# Patient Record
Sex: Female | Born: 1975
Health system: Southern US, Community
[De-identification: ages and names within clinical notes are randomized; demographics above are authoritative.]

## PROBLEM LIST (undated history)

## (undated) DIAGNOSIS — R8789 Other abnormal findings in specimens from female genital organs: Principal | ICD-10-CM

## (undated) DIAGNOSIS — F419 Anxiety disorder, unspecified: Secondary | ICD-10-CM

## (undated) DIAGNOSIS — N390 Urinary tract infection, site not specified: Secondary | ICD-10-CM

## (undated) DIAGNOSIS — N83209 Unspecified ovarian cyst, unspecified side: Secondary | ICD-10-CM

## (undated) DIAGNOSIS — Z7689 Persons encountering health services in other specified circumstances: Secondary | ICD-10-CM

## (undated) DIAGNOSIS — IMO0002 Reserved for concepts with insufficient information to code with codable children: Secondary | ICD-10-CM

## (undated) DIAGNOSIS — K219 Gastro-esophageal reflux disease without esophagitis: Secondary | ICD-10-CM

## (undated) DIAGNOSIS — G43909 Migraine, unspecified, not intractable, without status migrainosus: Secondary | ICD-10-CM

## (undated) DIAGNOSIS — N949 Unspecified condition associated with female genital organs and menstrual cycle: Secondary | ICD-10-CM

## (undated) HISTORY — DX: Urinary tract infection, site not specified: N39.0

## (undated) HISTORY — DX: Gastro-esophageal reflux disease without esophagitis: K21.9

## (undated) HISTORY — DX: Unspecified ovarian cyst, unspecified side: N83.209

## (undated) HISTORY — DX: Other abnormal findings in specimens from female genital organs: R87.89

## (undated) HISTORY — PX: WISDOM TOOTH EXTRACTION: SHX21

## (undated) HISTORY — DX: Unspecified condition associated with female genital organs and menstrual cycle: N94.9

## (undated) HISTORY — DX: Anxiety disorder, unspecified: F41.9

## (undated) HISTORY — DX: Reserved for concepts with insufficient information to code with codable children: IMO0002

## (undated) HISTORY — DX: Migraine, unspecified, not intractable, without status migrainosus: G43.909

## (undated) HISTORY — DX: Persons encountering health services in other specified circumstances: Z76.89

## (undated) HISTORY — PX: LAPAROSCOPY: SHX197

---

## 2001-03-05 ENCOUNTER — Ambulatory Visit (HOSPITAL_COMMUNITY): Admission: RE | Admit: 2001-03-05 | Discharge: 2001-03-05 | Payer: Self-pay | Admitting: Obstetrics and Gynecology

## 2008-10-27 ENCOUNTER — Other Ambulatory Visit: Admission: RE | Admit: 2008-10-27 | Discharge: 2008-10-27 | Payer: Self-pay | Admitting: Obstetrics and Gynecology

## 2010-01-25 ENCOUNTER — Other Ambulatory Visit: Admission: RE | Admit: 2010-01-25 | Discharge: 2010-01-25 | Payer: Self-pay | Admitting: Obstetrics and Gynecology

## 2010-08-31 NOTE — Op Note (Signed)
[facility]  Patient:    Michelle Gonzalez, Michelle Gonzalez Visit Number: [id number] MRN: [id number]          Service Type: DSU Location: DAY Attending Physician:  Tilda Burrow Dictated by:   Christin Bach, M.D. Proc. Date: 03/05/01 Admit Date:  03/05/2001 Discharge Date: 03/05/2001                             Operative Report  PREOPERATIVE DIAGNOSIS:  Right upper quadrant pain, suspect right ovarian cyst.  POSTOPERATIVE DIAGNOSIS:  Right upper quadrant pain, suspect right ovarian cyst.  PROCEDURE:  Diagnostic laparoscopy, tubal dye studies.  SURGEON:  Christin Bach, M.D.  ANESTHESIA:  General.  COMPLICATIONS:  None.  ESTIMATED BLOOD LOSS:  None.  FINDINGS:  Tiny amount of adhesions from the small bowel and terminal ileum to the pelvic brim on the right. Evidence of patency of fallopian tubes bilaterally. Relaxed round ligament structures bilaterally.  No evidence of endometriosis.  DESCRIPTION OF PROCEDURE:  The patient was taken to the operating room, prepped and draped in the usual fashion for abdominal and vaginal procedure. The infraumbilical, vertical 1 cm skin incision was used to achieve pneumoperitoneum with Veress needle used to insufflation.  Laparoscopic trocar was introduced through the umbilicus and the suprapubic 5 mm port placed under direct visualization.  There was no evidence of any trauma associated with insertion.  Attention was directed to the pelvis which could be visualized.  A Cohen cannula had already been attached to the cervix for tubal dye study. The patient then had visualization of the pelvic structures photographed, with with some filmy adhesions in the right lower quadrant at the level of the ileum. Tubal dye studies showed excellent patency of each fallopian tube.  The ovaries appeared grossly normal and there was no evidence of endometriosis.  There was extensive vascular network inside the pelvis.  The right  adnexa also had a simple cyst and the right ovary measuring approximately 3 cm.  This was considered to be an unruptured corpus luteum cyst or follicular cyst that was left alone.  The patient was then allowed to awaken after deflation of the abdomen was made by the laparoscopic deployment, and subcuticular 4-0 Dexon closure of the laparoscopic port.  The patient tolerated the procedure well and went to the recovery room in good condition.  ADDENDUM:  It should be noted that the right lower quadrant adhesions from the terminal ileum were cut free to improve pelvic mobility. Dictated by:   Christin Bach, M.D. Attending Physician:  Tilda Burrow DD:  03/29/01 TD:  03/29/01 Job: [id number]

## 2013-03-25 ENCOUNTER — Encounter: Payer: Self-pay | Admitting: Adult Health

## 2013-03-25 ENCOUNTER — Ambulatory Visit (INDEPENDENT_AMBULATORY_CARE_PROVIDER_SITE_OTHER): Payer: BC Managed Care – PPO | Admitting: Adult Health

## 2013-03-25 ENCOUNTER — Encounter (INDEPENDENT_AMBULATORY_CARE_PROVIDER_SITE_OTHER): Payer: Self-pay

## 2013-03-25 VITALS — BP 120/80 | Ht 66.0 in | Wt 166.0 lb

## 2013-03-25 DIAGNOSIS — N949 Unspecified condition associated with female genital organs and menstrual cycle: Secondary | ICD-10-CM

## 2013-03-25 HISTORY — DX: Unspecified condition associated with female genital organs and menstrual cycle: N94.9

## 2013-03-25 NOTE — Patient Instructions (Signed)
Pelvic Pain, Female Female pelvic pain can be caused by many different things and start from a variety of places. Pelvic pain refers to pain that is located in the lower half of the abdomen and between your hips. The pain may occur over a short period of time (acute) or may be reoccurring (chronic). The cause of pelvic pain may be related to disorders affecting the female reproductive organs (gynecologic), but it may also be related to the bladder, kidney stones, an intestinal complication, or muscle or skeletal problems. Getting help right away for pelvic pain is important, especially if there has been severe, sharp, or a sudden onset of unusual pain. It is also important to get help right away because some types of pelvic pain can be life threatening.  CAUSES  Below are only some of the causes of pelvic pain. The causes of pelvic pain can be in one of several categories.   Gynecologic.  Pelvic inflammatory disease.  Sexually transmitted infection.  Ovarian cyst or a twisted ovarian ligament (ovarian torsion).  Uterine lining that grows outside the uterus (endometriosis).  Fibroids, cysts, or tumors.  Ovulation.  Pregnancy.  Pregnancy that occurs outside the uterus (ectopic pregnancy).  Miscarriage.  Labor.  Abruption of the placenta or ruptured uterus.  Infection.  Uterine infection (endometritis).  Bladder infection.  Diverticulitis.  Miscarriage related to a uterine infection (septic abortion).  Bladder.  Inflammation of the bladder (cystitis).  Kidney stone(s).  Gastrointenstinal.  Constipation.  Diverticulitis.  Neurologic.  Trauma.  Feeling pelvic pain because of mental or emotional causes (psychosomatic).  Cancers of the bowel or pelvis. EVALUATION  Your caregiver will want to take a careful history of your concerns. This includes recent changes in your health, a careful gynecologic history of your periods (menses), and a sexual history. Obtaining  your family history and medical history is also important. Your caregiver may suggest a pelvic exam. A pelvic exam will help identify the location and severity of the pain. It also helps in the evaluation of which organ system may be involved. In order to identify the cause of the pelvic pain and be properly treated, your caregiver may order tests. These tests may include:   A pregnancy test.  Pelvic ultrasonography.  An X-ray exam of the abdomen.  A urinalysis or evaluation of vaginal discharge.  Blood tests. HOME CARE INSTRUCTIONS   Only take over-the-counter or prescription medicines for pain, discomfort, or fever as directed by your caregiver.   Rest as directed by your caregiver.   Eat a balanced diet.   Drink enough fluids to make your urine clear or pale yellow, or as directed.   Avoid sexual intercourse if it causes pain.   Apply warm or cold compresses to the lower abdomen depending on which one helps the pain.   Avoid stressful situations.   Keep a journal of your pelvic pain. Write down when it started, where the pain is located, and if there are things that seem to be associated with the pain, such as food or your menstrual cycle.  Follow up with your caregiver as directed.  SEEK MEDICAL CARE IF:  Your medicine does not help your pain.  You have abnormal vaginal discharge. SEEK IMMEDIATE MEDICAL CARE IF:   You have heavy bleeding from the vagina.   Your pelvic pain increases.   You feel lightheaded or faint.   You have chills.   You have pain with urination or blood in your urine.   You have uncontrolled  diarrhea or vomiting.   You have a fever or persistent symptoms for more than 3 days.  You have a fever and your symptoms suddenly get worse.   You are being physically or sexually abused.  MAKE SURE YOU:  Understand these instructions.  Will watch your condition.  Will get help if you are not doing well or get worse. Document  Released: 02/27/2004 Document Revised: 10/01/2011 Document Reviewed: 07/22/2011 [facility] Patient Information 2014 [place], [place]. Follow up in 5 days for [place]

## 2013-03-25 NOTE — Progress Notes (Signed)
Subjective:     Patient ID: Michelle Gonzalez, female   DOB: 01/02/1976, 37 y.o.   MRN: [id number]  HPI Michelle Gonzalez is a 37 year old white female, married in complaining of pelvic pain, nausea at times, and recurrent UTIs, occasional discharge with odor.Sex hurts at times.Has ruptured disc at L5 and is on meds, also takes macrobid with sex.Has noted periods are heavier.  Review of Systems See HPI Reviewed past medical,surgical, social and family history. Reviewed medications and allergies.     Objective:   Physical Exam BP 120/80  Ht 5\' 6"  (1.676 m)  Wt 166 lb (75.297 kg)  BMI 26.81 kg/m2  LMP 03/17/2013   urine negative, Skin warm and dry.Pelvic: external genitalia is normal in appearance, vagina: white discharge without odor, cervix:smooth, uterus: normal size, shape and contour, non tender, no masses felt, adnexa: no masses, has tenderness noted both ovary area.  Assessment:    Pelvic pain    Plan:     Follow up in 5 days for [place] and see me Review handout on pelvic pain

## 2013-03-30 ENCOUNTER — Other Ambulatory Visit: Payer: Self-pay | Admitting: Adult Health

## 2013-03-30 ENCOUNTER — Encounter: Payer: Self-pay | Admitting: Adult Health

## 2013-03-30 ENCOUNTER — Ambulatory Visit (INDEPENDENT_AMBULATORY_CARE_PROVIDER_SITE_OTHER): Payer: BC Managed Care – PPO | Admitting: Adult Health

## 2013-03-30 ENCOUNTER — Ambulatory Visit (INDEPENDENT_AMBULATORY_CARE_PROVIDER_SITE_OTHER): Payer: BC Managed Care – PPO

## 2013-03-30 VITALS — BP 124/80 | Ht 67.0 in | Wt 166.0 lb

## 2013-03-30 DIAGNOSIS — N920 Excessive and frequent menstruation with regular cycle: Secondary | ICD-10-CM

## 2013-03-30 DIAGNOSIS — N83209 Unspecified ovarian cyst, unspecified side: Secondary | ICD-10-CM | POA: Insufficient documentation

## 2013-03-30 DIAGNOSIS — IMO0002 Reserved for concepts with insufficient information to code with codable children: Secondary | ICD-10-CM

## 2013-03-30 DIAGNOSIS — N949 Unspecified condition associated with female genital organs and menstrual cycle: Secondary | ICD-10-CM

## 2013-03-30 HISTORY — DX: Unspecified ovarian cyst, unspecified side: N83.209

## 2013-03-30 MED ORDER — NORETHIN ACE-ETH ESTRAD-FE 1-20 MG-MCG(24) PO CHEW: 1.0000 | CHEWABLE_TABLET | Freq: Every day | ORAL | Status: DC

## 2013-03-30 NOTE — Patient Instructions (Signed)
Menorrhagia Dysfunctional uterine bleeding is different from a normal menstrual period. When periods are heavy or there is more bleeding than is usual for you, it is called menorrhagia. It may be caused by hormonal imbalance, or physical, metabolic, or other problems. Examination is necessary in order that your caregiver may treat treatable causes. If this is a continuing problem, a D&C may be needed. That means that the cervix (the opening of the uterus or womb) is dilated (stretched larger) and the lining of the uterus is scraped out. The tissue scraped out is then examined under a microscope by a specialist (pathologist) to make sure there is nothing of concern that needs further or more extensive treatment. HOME CARE INSTRUCTIONS   If medications were prescribed, take exactly as directed. Do not change or switch medications without consulting your caregiver.  Long term heavy bleeding may result in iron deficiency. Your caregiver may have prescribed iron pills. They help replace the iron your body lost from heavy bleeding. Take exactly as directed. Iron may cause constipation. If this becomes a problem, increase the bran, fruits, and roughage in your diet.  Do not take aspirin or medicines that contain aspirin one week before or during your menstrual period. Aspirin may make the bleeding worse.  If you need to change your sanitary pad or tampon more than once every 2 hours, stay in bed and rest as much as possible until the bleeding stops.  Eat well-balanced meals. Eat foods high in iron. Examples are leafy green vegetables, meat, liver, eggs, and whole grain breads and cereals. Do not try to lose weight until the abnormal bleeding has stopped and your blood iron level is back to normal. SEEK MEDICAL CARE IF:   You need to change your sanitary pad or tampon more than once an hour.  You develop nausea (feeling sick to your stomach) and vomiting, dizziness, or diarrhea while you are taking your  medicine.  You have any problems that may be related to the medicine you are taking. SEEK IMMEDIATE MEDICAL CARE IF:   You have a fever.  You develop chills.  You develop severe bleeding or start to pass blood clots.  You feel dizzy or faint. MAKE SURE YOU:   Understand these instructions.  Will watch your condition.  Will get help right away if you are not doing well or get worse. Document Released: 04/01/2005 Document Revised: 06/24/2011 Document Reviewed: 09/20/2012 [facility] Patient Information 2014 [place], [place]. Ovarian Cyst The ovaries are small organs that are on each side of the uterus. The ovaries are the organs that produce the female hormones, estrogen and progesterone. An ovarian cyst is a sac filled with fluid that can vary in its size. It is normal for a small cyst to form in women who are in the childbearing age and who have menstrual periods. This type of cyst is called a follicle cyst that becomes an ovulation cyst (corpus luteum cyst) after it produces the women's egg. It later goes away on its own if the woman does not become pregnant. There are other kinds of ovarian cysts that may cause problems and may need to be treated. The most serious problem is a cyst with cancer. It should be noted that menopausal women who have an ovarian cyst are at a higher risk of it being a cancer cyst. They should be evaluated very quickly, thoroughly and followed closely. This is especially true in menopausal women because of the high rate of ovarian cancer in women in  menopause. CAUSES AND TYPES OF OVARIAN CYSTS:  FUNCTIONAL CYST: The follicle/corpus luteum cyst is a functional cyst that occurs every month during ovulation with the menstrual cycle. They go away with the next menstrual cycle if the woman does not get pregnant. Usually, there are no symptoms with a functional cyst.  ENDOMETRIOMA CYST: This cyst develops from the lining of the uterus tissue. This cyst gets in or on the  ovary. It grows every month from the bleeding during the menstrual period. It is also called a "chocolate cyst" because it becomes filled with blood that turns brown. This cyst can cause pain in the lower abdomen during intercourse and with your menstrual period.  CYSTADENOMA CYST: This cyst develops from the cells on the outside of the ovary. They usually are not cancerous. They can get very big and cause lower abdomen pain and pain with intercourse. This type of cyst can twist on itself, cut off its blood supply and cause severe pain. It also can easily rupture and cause a lot of pain.  DERMOID CYST: This type of cyst is sometimes found in both ovaries. They are found to have different kinds of body tissue in the cyst. The tissue includes skin, teeth, hair, and/or cartilage. They usually do not have symptoms unless they get very big. Dermoid cysts are rarely cancerous.  POLYCYSTIC OVARY: This is a rare condition with hormone problems that produces many small cysts on both ovaries. The cysts are follicle-like cysts that never produce an egg and become a corpus luteum. It can cause an increase in body weight, infertility, acne, increase in body and facial hair and lack of menstrual periods or rare menstrual periods. Many women with this problem develop type 2 diabetes. The exact cause of this problem is unknown. A polycystic ovary is rarely cancerous.  THECA LUTEIN CYST: Occurs when too much hormone (human chorionic gonadotropin) is produced and over-stimulates the ovaries to produce an egg. They are frequently seen when doctors stimulate the ovaries for invitro-fertilization (test tube babies).  LUTEOMA CYST: This cyst is seen during pregnancy. Rarely it can cause an obstruction to the birth canal during labor and delivery. They usually go away after delivery. SYMPTOMS   Pelvic pain or pressure.  Pain during sexual intercourse.  Increasing girth (swelling) of the abdomen.  Abnormal menstrual  periods.  Increasing pain with menstrual periods.  You stop having menstrual periods and you are not pregnant. DIAGNOSIS  The diagnosis can be made during:  Routine or annual pelvic examination (common).  Ultrasound.  X-ray of the pelvis.  CT Scan.  MRI.  Blood tests. TREATMENT   Treatment may only be to follow the cyst monthly for 2 to 3 months with your caregiver. Many go away on their own, especially functional cysts.  May be aspirated (drained) with a long needle with ultrasound, or by laparoscopy (inserting a tube into the pelvis through a small incision).  The whole cyst can be removed by laparoscopy.  Sometimes the cyst may need to be removed through an incision in the lower abdomen.  Hormone treatment is sometimes used to help dissolve certain cysts.  Birth control pills are sometimes used to help dissolve certain cysts. HOME CARE INSTRUCTIONS  Follow your caregiver's advice regarding:  Medicine.  Follow up visits to evaluate and treat the cyst.  You may need to come back or make an appointment with another caregiver, to find the exact cause of your cyst, if your caregiver is not a gynecologist.  Get  your yearly and recommended pelvic examinations and Pap tests.  Let your caregiver know if you have had an ovarian cyst in the past. SEEK MEDICAL CARE IF:   Your periods are late, irregular, they stop, or are painful.  Your stomach (abdomen) or pelvic pain does not go away.  Your stomach becomes larger or swollen.  You have pressure on your bladder or trouble emptying your bladder completely.  You have painful sexual intercourse.  You have feelings of fullness, pressure, or discomfort in your stomach.  You lose weight for no apparent reason.  You feel generally ill.  You become constipated.  You lose your appetite.  You develop acne.  You have an increase in body and facial hair.  You are gaining weight, without changing your exercise and  eating habits.  You think you are pregnant. SEEK IMMEDIATE MEDICAL CARE IF:   You have increasing abdominal pain.  You feel sick to your stomach (nausea) and/or vomit.  You develop a fever that comes on suddenly.  You develop abdominal pain during a bowel movement.  Your menstrual periods become heavier than usual. Document Released: 04/01/2005 Document Revised: 06/24/2011 Document Reviewed: 02/02/2009 [facility] Patient Information 2014 [place], [place]. Start pills with period Follow up in 3 months for [place]

## 2013-03-30 NOTE — Progress Notes (Signed)
Subjective:     Patient ID: Michelle Gonzalez, female   DOB: 16-Aug-1975, 37 y.o.   MRN: [id number]  HPI Michelle Gonzalez is back for [place] for pain with sex and bleeding heavy.Says she bleeds heavy for about 3 days, changes pad every 1-2 hours at times.  Review of Systems See HPI Reviewed past medical,surgical, social and family history. Reviewed medications and allergies.     Objective:   Physical Exam BP 124/80  Ht 5\' 7"  (1.702 m)  Wt 166 lb (75.297 kg)  BMI 25.99 kg/m2  LMP 03/17/2013   Reviewed [place] with pt and husband, US shows a 8.6 x 5.3 x 4.0 uterus without masses, an endometrium of 14.3 mm no masses, left ovary 2.4 x 1.9 x 1.3 cm and a right ovary 3.2 x 2.4 x 2.3 cm with a 2.4 x 1.6 cm complex cyst with septation and + doppler flow. Discussed with Dr Despina Hidden, will try suppression with OCs and [place] in 3 months  Assessment:     Right ovarian cyst Menorrhagia    Plan:     Change positions with sex Try Minastrin Number of samples 3 packs  Lot number [id number]    Exp date 3/16, start with next period Review handouts on ovarian cyst and menorrhagia Follow up in 3 months for [place] and see me, call prn problems

## 2013-06-28 ENCOUNTER — Ambulatory Visit (INDEPENDENT_AMBULATORY_CARE_PROVIDER_SITE_OTHER): Payer: BC Managed Care – PPO | Admitting: Adult Health

## 2013-06-28 ENCOUNTER — Encounter: Payer: Self-pay | Admitting: Adult Health

## 2013-06-28 ENCOUNTER — Ambulatory Visit (INDEPENDENT_AMBULATORY_CARE_PROVIDER_SITE_OTHER): Payer: BC Managed Care – PPO

## 2013-06-28 ENCOUNTER — Other Ambulatory Visit: Payer: Self-pay | Admitting: Adult Health

## 2013-06-28 ENCOUNTER — Encounter (INDEPENDENT_AMBULATORY_CARE_PROVIDER_SITE_OTHER): Payer: Self-pay

## 2013-06-28 VITALS — BP 122/66 | Ht 67.0 in | Wt 165.0 lb

## 2013-06-28 DIAGNOSIS — Z01419 Encounter for gynecological examination (general) (routine) without abnormal findings: Secondary | ICD-10-CM | POA: Insufficient documentation

## 2013-06-28 DIAGNOSIS — N920 Excessive and frequent menstruation with regular cycle: Secondary | ICD-10-CM

## 2013-06-28 DIAGNOSIS — N83209 Unspecified ovarian cyst, unspecified side: Secondary | ICD-10-CM

## 2013-06-28 DIAGNOSIS — Z7689 Persons encountering health services in other specified circumstances: Secondary | ICD-10-CM

## 2013-06-28 DIAGNOSIS — Z8742 Personal history of other diseases of the female genital tract: Secondary | ICD-10-CM

## 2013-06-28 HISTORY — DX: Persons encountering health services in other specified circumstances: Z76.89

## 2013-06-28 MED ORDER — NORETHIN ACE-ETH ESTRAD-FE 1-20 MG-MCG(24) PO CHEW: 1.0000 | CHEWABLE_TABLET | Freq: Every day | ORAL | Status: DC

## 2013-06-28 NOTE — Patient Instructions (Signed)
Follow up in 3 months Continue OCs

## 2013-06-28 NOTE — Progress Notes (Signed)
Subjective:     Patient ID: Michelle Gonzalez, female   DOB: [birth year], 38 y.o.   MRN: [id number]  HPI [name] is a 38 year old white female back for [place] to see if ovarian cyst resolved and check bleeding, since starting OCs and she says bleeding much better. And she wants to stay on the OCs.   Review of Systems See HPI Reviewed past medical,surgical, social and family history. Reviewed medications and allergies.     Objective:   Physical Exam BP 122/66  Ht 5\' 7"  (1.702 m)  Wt 165 lb (74.844 kg)  BMI 25.84 kg/m2  LMP 06/25/2013   Reviewed [place] with pt,  Uterus 7.8 x 5.1 x 3.5 cm, no myometrial masses noted  Endometrium 7.3 mm, symmetrical,  Right ovary 3.0 x 1.8 x 1.5 cm, cyst resolved  Left ovary 2.5 x 1.9 x 1.4 cm,  No free fluid or adnexal masses noted within pelvis  Technician Comments:  Transvaginal u/s performed, Endom-7.61mm, Rt ovary appears WNL cyst resolved, Lt ovary appears wnl no free fluid or adnexal masses noted     Assessment:    Period management Menorrhagia resolved with OCs History of ovarian cyst     Plan:     Continue minastrin, refilled x 1 year Return in 3 months for physical and pap

## 2013-09-28 ENCOUNTER — Other Ambulatory Visit: Payer: BC Managed Care – PPO | Admitting: Adult Health

## 2014-01-05 ENCOUNTER — Encounter: Payer: Self-pay | Admitting: Adult Health

## 2014-01-05 ENCOUNTER — Ambulatory Visit (INDEPENDENT_AMBULATORY_CARE_PROVIDER_SITE_OTHER): Payer: BC Managed Care – PPO | Admitting: Adult Health

## 2014-01-05 ENCOUNTER — Other Ambulatory Visit (HOSPITAL_COMMUNITY)
Admission: RE | Admit: 2014-01-05 | Discharge: 2014-01-05 | Disposition: A | Discharge status: Home / Self Care | Payer: BC Managed Care – PPO | Source: Ambulatory Visit | Attending: Adult Health | Admitting: Adult Health

## 2014-01-05 VITALS — BP 130/70 | HR 76 | Ht 65.25 in | Wt 162.0 lb

## 2014-01-05 DIAGNOSIS — Z124 Encounter for screening for malignant neoplasm of cervix: Secondary | ICD-10-CM | POA: Diagnosis present

## 2014-01-05 DIAGNOSIS — F419 Anxiety disorder, unspecified: Secondary | ICD-10-CM

## 2014-01-05 DIAGNOSIS — Z7689 Persons encountering health services in other specified circumstances: Secondary | ICD-10-CM

## 2014-01-05 DIAGNOSIS — IMO0002 Reserved for concepts with insufficient information to code with codable children: Secondary | ICD-10-CM

## 2014-01-05 DIAGNOSIS — Z01419 Encounter for gynecological examination (general) (routine) without abnormal findings: Secondary | ICD-10-CM

## 2014-01-05 DIAGNOSIS — Z1151 Encounter for screening for human papillomavirus (HPV): Secondary | ICD-10-CM | POA: Insufficient documentation

## 2014-01-05 HISTORY — DX: Reserved for concepts with insufficient information to code with codable children: IMO0002

## 2014-01-05 LAB — CBC
HCT: 38.7 % (ref 36.0–46.0)
Hemoglobin: 12.7 g/dL (ref 12.0–15.0)
MCH: 29.3 pg (ref 26.0–34.0)
MCHC: 32.8 g/dL (ref 30.0–36.0)
MCV: 89.2 fL (ref 78.0–100.0)
Platelets: 305 10*3/uL (ref 150–400)
RBC: 4.34 MIL/uL (ref 3.87–5.11)
RDW: 13.2 % (ref 11.5–15.5)
WBC: 6.3 10*3/uL (ref 4.0–10.5)

## 2014-01-05 LAB — COMPREHENSIVE METABOLIC PANEL
ALT: 10 U/L (ref 0–35)
AST: 15 U/L (ref 0–37)
Albumin: 3.9 g/dL (ref 3.5–5.2)
Alkaline Phosphatase: 64 U/L (ref 39–117)
BUN: 9 mg/dL (ref 6–23)
CO2: 25 mEq/L (ref 19–32)
Calcium: 9.3 mg/dL (ref 8.4–10.5)
Chloride: 103 mEq/L (ref 96–112)
Creat: 0.69 mg/dL (ref 0.50–1.10)
Glucose, Bld: 81 mg/dL (ref 70–99)
Potassium: 4.3 mEq/L (ref 3.5–5.3)
Sodium: 135 mEq/L (ref 135–145)
Total Bilirubin: 0.4 mg/dL (ref 0.2–1.2)
Total Protein: 6.8 g/dL (ref 6.0–8.3)

## 2014-01-05 LAB — LIPID PANEL
Cholesterol: 158 mg/dL (ref 0–200)
HDL: 57 mg/dL (ref >39)
LDL Cholesterol: 87 mg/dL (ref 0–99)
Total CHOL/HDL Ratio: 2.8 Ratio
Triglycerides: 70 mg/dL (ref <150)
VLDL: 14 mg/dL (ref 0–40)

## 2014-01-05 LAB — TSH: TSH: 1.243 u[IU]/mL (ref 0.350–4.500)

## 2014-01-05 MED ORDER — NORETHIN ACE-ETH ESTRAD-FE 1-20 MG-MCG(24) PO CHEW: 1.0000 | CHEWABLE_TABLET | Freq: Every day | ORAL | Status: DC

## 2014-01-05 NOTE — Patient Instructions (Signed)
Physical in 1 year Mammogram at 39  Try benadryl before sex

## 2014-01-05 NOTE — Progress Notes (Signed)
Patient ID: Michelle Gonzalez, female   DOB: 1975-08-03, 38 y.o.   MRN: [id number] History of Present Illness: Michelle Gonzalez is a 38 year old white female, married,in for a pap and physical.Only complaint is of pain/irritation with sex.Pt is losing her job.   Current Medications, Allergies, Past Medical History, Past Surgical History, Family History and Social History were reviewed in [employer] record.     Review of Systems: Patient denies any headaches, blurred vision, shortness of breath, chest pain, abdominal pain, problems with bowel movements, urination, or intercourse. No joint pain or mood changes.    Physical Exam:BP 130/70  Pulse 76  Ht 5' 5.25" (1.657 m)  Wt 162 lb (73.483 kg)  BMI 26.76 kg/m2  LMP 12/14/2013 General:  Well developed, well nourished, no acute distress Skin:  Warm and dry Neck:  Midline trachea, normal thyroid Lungs; Clear to auscultation bilaterally Breast:  No dominant palpable mass, retraction, or nipple discharge Cardiovascular: Regular rate and rhythm Abdomen:  Soft, non tender, no hepatosplenomegaly Pelvic:  External genitalia is normal in appearance.  The vagina is normal in appearance.  The cervix is smooth and friable with EC brush, pap with HPV.  Uterus is felt to be normal size, shape, and contour.  No adnexal masses or tenderness noted. Extremities:  No swelling or varicosities noted Psych:  No mood changes, alert and cooperative,seems happy   Impression: Yearly gyn exam Period management Dyspareunia  Anxiety     Plan: Refilled minastrin x 1 year Try benadryl before sex  Continue lubrication  Physical in 1 year Mammogram at 39 Check CBC,CMP,TSH and lipids

## 2014-01-06 LAB — CYTOLOGY - PAP

## 2014-01-07 ENCOUNTER — Telehealth: Payer: Self-pay | Admitting: Adult Health

## 2014-01-07 NOTE — Telephone Encounter (Signed)
Pt aware labs great and pap normal with negative HPV

## 2014-02-14 ENCOUNTER — Encounter: Payer: Self-pay | Admitting: Adult Health

## 2014-05-18 ENCOUNTER — Other Ambulatory Visit: Payer: Self-pay | Admitting: *Deleted

## 2014-05-18 MED ORDER — NORETHIN ACE-ETH ESTRAD-FE 1-20 MG-MCG(24) PO CHEW: 1.0000 | CHEWABLE_TABLET | Freq: Every day | ORAL | Status: DC

## 2015-04-27 ENCOUNTER — Other Ambulatory Visit: Payer: Self-pay | Admitting: Adult Health

## 2015-05-23 ENCOUNTER — Telehealth: Payer: Self-pay | Admitting: Adult Health

## 2015-05-23 MED ORDER — NORETHIN ACE-ETH ESTRAD-FE 1-20 MG-MCG(24) PO CAPS: 1.0000 | ORAL_CAPSULE | Freq: Every day | ORAL | Status: DC

## 2015-05-23 MED ORDER — NORETHIN ACE-ETH ESTRAD-FE 1-20 MG-MCG PO TABS: 1.0000 | ORAL_TABLET | Freq: Every day | ORAL | Status: DC

## 2015-05-23 NOTE — Telephone Encounter (Signed)
Will try taytulla, rx sent, likes not having a period

## 2015-05-23 NOTE — Telephone Encounter (Signed)
Taytulla too expensive too, rx junel 1-20

## 2015-05-23 NOTE — Telephone Encounter (Signed)
Spoke with pt. Pt states the Michelle Gonzalez is $150.00. Her insurance covers Michelle Gonzalez. How similar is Michelle Gonzalez to Michelle Gonzalez? She was on Michelle Gonzalez but had to switch due to insurance. She has heavy periods and Michelle Gonzalez helped control bleeding. Please advise. Thanks!! [place]

## 2015-06-06 ENCOUNTER — Other Ambulatory Visit: Payer: Self-pay | Admitting: Adult Health

## 2015-06-09 ENCOUNTER — Encounter: Payer: Self-pay | Admitting: Adult Health

## 2015-06-09 ENCOUNTER — Ambulatory Visit (INDEPENDENT_AMBULATORY_CARE_PROVIDER_SITE_OTHER): Payer: 59 | Admitting: Adult Health

## 2015-06-09 VITALS — BP 112/64 | HR 80 | Ht 65.0 in | Wt 162.0 lb

## 2015-06-09 DIAGNOSIS — Z01419 Encounter for gynecological examination (general) (routine) without abnormal findings: Secondary | ICD-10-CM | POA: Diagnosis not present

## 2015-06-09 DIAGNOSIS — Z1211 Encounter for screening for malignant neoplasm of colon: Secondary | ICD-10-CM | POA: Diagnosis not present

## 2015-06-09 DIAGNOSIS — Z7689 Persons encountering health services in other specified circumstances: Secondary | ICD-10-CM

## 2015-06-09 LAB — HEMOCCULT GUIAC POC 1CARD (OFFICE): Fecal Occult Blood, POC: NEGATIVE

## 2015-06-09 MED ORDER — NORETHIN ACE-ETH ESTRAD-FE 1-20 MG-MCG PO TABS: ORAL_TABLET | ORAL | Status: DC

## 2015-06-09 NOTE — Progress Notes (Signed)
Patient ID: Michelle Gonzalez, female   DOB: [birth year], 40 y.o.   MRN: [id number] History of Present Illness: Michelle Gonzalez is a 40 year old white female, married, in for well woman gyn exam, she had a normal pap 01/05/14.She takes OCs to control her periods, they used to be heavy but does not have on the pill, she was minastrin and loved them but insurance will not cover, so on junel now. PCP is Dr Quintin Alto in [place], she has had labs with him.  Current Medications, Allergies, Past Medical History, Past Surgical History, Family History and Social History were reviewed in [employer] record.     Review of Systems: Patient denies any headaches, hearing loss, fatigue, blurred vision, shortness of breath, chest pain, abdominal pain, problems with bowel movements, urination, or intercourse. No joint pain or mood swings.See HPI for positives.    Physical Exam:BP 112/64 mmHg  Pulse 80  Ht 5\' 5"  (1.651 m)  Wt 162 lb (73.483 kg)  BMI 26.96 kg/m2 General:  Well developed, well nourished, no acute distress Skin:  Warm and dry Neck:  Midline trachea, normal thyroid, good ROM, no lymphadenopathy Lungs; Clear to auscultation bilaterally Breast:  No dominant palpable mass, retraction, or nipple discharge Cardiovascular: Regular rate and rhythm Abdomen:  Soft, non tender, no hepatosplenomegaly Pelvic:  External genitalia is normal in appearance, no lesions.  The vagina is normal in appearance. Urethra has no lesions or masses. The cervix is bulbous.  Uterus is felt to be normal size, shape, and contour.  No adnexal masses or tenderness noted.Bladder is non tender, no masses felt. Rectal: Good sphincter tone, no polyps, or hemorrhoids felt.  Hemoccult negative. Extremities/musculoskeletal:  No swelling or varicosities noted, no clubbing or cyanosis Psych:  No mood changes, alert and cooperative,seems happy   Impression: Well woman gyn exam no pap Period management    Plan: Rx junel  1-20 take 1 continuously, with 11 refills Physical in 1 year with pap Labs per PCP Mammogram now and yearly

## 2015-06-09 NOTE — Patient Instructions (Signed)
Take junel continuously Physical in 1 year  Mammogram now and  yearly

## 2015-06-12 ENCOUNTER — Other Ambulatory Visit: Payer: Self-pay | Admitting: Adult Health

## 2015-12-19 ENCOUNTER — Telehealth: Payer: Self-pay | Admitting: Adult Health

## 2015-12-19 MED ORDER — NORETHIN ACE-ETH ESTRAD-FE 1-20 MG-MCG PO TABS: ORAL_TABLET | ORAL | 11 refills | Status: DC

## 2015-12-19 NOTE — Telephone Encounter (Signed)
rx for OCs transferred to [facility]

## 2016-08-14 ENCOUNTER — Other Ambulatory Visit: Payer: Self-pay | Admitting: Adult Health

## 2016-10-03 ENCOUNTER — Ambulatory Visit (INDEPENDENT_AMBULATORY_CARE_PROVIDER_SITE_OTHER): Payer: BLUE CROSS/BLUE SHIELD | Admitting: Adult Health

## 2016-10-03 ENCOUNTER — Encounter: Payer: Self-pay | Admitting: Adult Health

## 2016-10-03 ENCOUNTER — Telehealth: Payer: Self-pay | Admitting: Adult Health

## 2016-10-03 VITALS — BP 124/80 | HR 73 | Ht 67.0 in | Wt 149.4 lb

## 2016-10-03 DIAGNOSIS — Z319 Encounter for procreative management, unspecified: Secondary | ICD-10-CM | POA: Diagnosis not present

## 2016-10-03 DIAGNOSIS — Z3202 Encounter for pregnancy test, result negative: Secondary | ICD-10-CM

## 2016-10-03 DIAGNOSIS — R110 Nausea: Secondary | ICD-10-CM

## 2016-10-03 DIAGNOSIS — N920 Excessive and frequent menstruation with regular cycle: Secondary | ICD-10-CM

## 2016-10-03 LAB — POCT URINE PREGNANCY: Preg Test, Ur: NEGATIVE

## 2016-10-03 MED ORDER — PRENATAL PLUS 27-1 MG PO TABS: 1.0000 | ORAL_TABLET | Freq: Every day | ORAL | 12 refills | Status: DC

## 2016-10-03 NOTE — Telephone Encounter (Signed)
Spoke with pt letting her know JAG can see her at 1:30 today. Pt voiced understanding. [place]

## 2016-10-03 NOTE — Progress Notes (Signed)
Subjective:     Patient ID: Michelle Gonzalez, female   DOB: [birth year], 41 y.o.   MRN: [id number]  HPI Michelle Gonzalez is s 41 year old white female, separated in for UPT.She stopped her OCs about a month ago and has been having unprotected sex, would like to be pregnant.She has had negative HPT, but complains of nausea, breat tenderness, spotted brown 6/8 and uterus tender.   Review of Systems Nausea  spotting Breast tenderness  uterus tender Reviewed past medical,surgical, social and family history. Reviewed medications and allergies.     Objective:   Physical Exam BP 124/80 (BP Location: Right Arm, Patient Position: Sitting, Cuff Size: Normal)   Pulse 73   Ht 5\' 7"  (1.702 m)   Wt 149 lb 6.4 oz (67.8 kg)   LMP 09/20/2016 Comment: spotted brown  BMI 23.40 kg/m UPT negative, Skin warm and dry. Neck: mid line trachea, normal thyroid, good ROM, no lymphadenopathy noted. Lungs: clear to ausculation bilaterally. Cardiovascular: regular rate and rhythm.Abdomen is soft and mildly tender over uterus. Discussed timing of sex and increased risk of Downs after 82. Reviewed meds, OK to take prozac and Inderal for now, she said she would stop the other meds.    Face time 15 minutes with 50 % counseling and coordinating care.  Assessment:     1. Nausea   2. Spotting   3. Pregnancy examination or test, negative result   4. Patient desires pregnancy       Plan:     Rx prenatal plus #30 take 1 daily with 12 refills Check QHCG now GC/CHL sent on urine Follow up prn

## 2016-10-04 LAB — BETA HCG QUANT (REF LAB): hCG Quant: <1 m[IU]/mL

## 2016-10-05 LAB — GC/CHLAMYDIA PROBE AMP
Chlamydia trachomatis, NAA: NEGATIVE
Neisseria gonorrhoeae by PCR: NEGATIVE

## 2016-10-07 ENCOUNTER — Telehealth: Payer: Self-pay | Admitting: Adult Health

## 2016-10-07 NOTE — Telephone Encounter (Signed)
Pt aware that QHCG <1 and GC/chlamydia both negative, she started period Friday night

## 2016-10-08 DIAGNOSIS — Z6824 Body mass index (BMI) 24.0-24.9, adult: Secondary | ICD-10-CM | POA: Diagnosis not present

## 2016-10-08 DIAGNOSIS — J209 Acute bronchitis, unspecified: Secondary | ICD-10-CM | POA: Diagnosis not present

## 2016-10-25 ENCOUNTER — Telehealth: Payer: Self-pay | Admitting: Adult Health

## 2016-10-25 NOTE — Telephone Encounter (Signed)
Patient called and stated she and her boyfriend have decided to try not to conceive at this time. She wants to start taking her BCP again and did not know when to start. Advised patient she could start taking immediately and to take continuously as prescribed before. Verbalized understanding.

## 2017-01-08 DIAGNOSIS — Z79891 Long term (current) use of opiate analgesic: Secondary | ICD-10-CM | POA: Diagnosis not present

## 2017-01-08 DIAGNOSIS — G894 Chronic pain syndrome: Secondary | ICD-10-CM | POA: Diagnosis not present

## 2017-01-08 DIAGNOSIS — M5136 Other intervertebral disc degeneration, lumbar region: Secondary | ICD-10-CM | POA: Diagnosis not present

## 2017-01-08 DIAGNOSIS — M47816 Spondylosis without myelopathy or radiculopathy, lumbar region: Secondary | ICD-10-CM | POA: Diagnosis not present

## 2017-02-17 DIAGNOSIS — B373 Candidiasis of vulva and vagina: Secondary | ICD-10-CM | POA: Diagnosis not present

## 2017-02-17 DIAGNOSIS — F411 Generalized anxiety disorder: Secondary | ICD-10-CM | POA: Diagnosis not present

## 2017-02-17 DIAGNOSIS — F5101 Primary insomnia: Secondary | ICD-10-CM | POA: Diagnosis not present

## 2017-02-17 DIAGNOSIS — G43009 Migraine without aura, not intractable, without status migrainosus: Secondary | ICD-10-CM | POA: Diagnosis not present

## 2017-03-27 DIAGNOSIS — M47816 Spondylosis without myelopathy or radiculopathy, lumbar region: Secondary | ICD-10-CM | POA: Diagnosis not present

## 2017-03-27 DIAGNOSIS — M5136 Other intervertebral disc degeneration, lumbar region: Secondary | ICD-10-CM | POA: Diagnosis not present

## 2017-03-28 ENCOUNTER — Telehealth: Payer: Self-pay | Admitting: *Deleted

## 2017-03-28 MED ORDER — METRONIDAZOLE 500 MG PO TABS: 500.0000 mg | ORAL_TABLET | Freq: Two times a day (BID) | ORAL | 0 refills | Status: DC

## 2017-03-28 NOTE — Telephone Encounter (Signed)
Pt says she has BV, will rx flagyl

## 2017-04-30 DIAGNOSIS — M47816 Spondylosis without myelopathy or radiculopathy, lumbar region: Secondary | ICD-10-CM | POA: Diagnosis not present

## 2017-04-30 DIAGNOSIS — G894 Chronic pain syndrome: Secondary | ICD-10-CM | POA: Diagnosis not present

## 2017-04-30 DIAGNOSIS — M5136 Other intervertebral disc degeneration, lumbar region: Secondary | ICD-10-CM | POA: Diagnosis not present

## 2017-04-30 DIAGNOSIS — G44229 Chronic tension-type headache, not intractable: Secondary | ICD-10-CM | POA: Diagnosis not present

## 2017-05-01 ENCOUNTER — Encounter (HOSPITAL_COMMUNITY): Payer: Self-pay | Admitting: Emergency Medicine

## 2017-05-01 ENCOUNTER — Emergency Department (HOSPITAL_COMMUNITY): Payer: BLUE CROSS/BLUE SHIELD

## 2017-05-01 ENCOUNTER — Emergency Department (HOSPITAL_COMMUNITY)
Admission: EM | Admit: 2017-05-01 | Discharge: 2017-05-01 | Disposition: A | Discharge status: Home / Self Care | Payer: BLUE CROSS/BLUE SHIELD | Attending: Emergency Medicine | Admitting: Emergency Medicine

## 2017-05-01 ENCOUNTER — Other Ambulatory Visit: Payer: Self-pay

## 2017-05-01 DIAGNOSIS — R519 Headache, unspecified: Secondary | ICD-10-CM

## 2017-05-01 DIAGNOSIS — G43909 Migraine, unspecified, not intractable, without status migrainosus: Secondary | ICD-10-CM | POA: Diagnosis not present

## 2017-05-01 DIAGNOSIS — Z79899 Other long term (current) drug therapy: Secondary | ICD-10-CM | POA: Diagnosis not present

## 2017-05-01 DIAGNOSIS — R51 Headache: Secondary | ICD-10-CM | POA: Insufficient documentation

## 2017-05-01 MED ORDER — BUTALBITAL-APAP-CAFFEINE 50-325-40 MG PO TABS: 1.0000 | ORAL_TABLET | Freq: Four times a day (QID) | ORAL | 0 refills | Status: DC | PRN

## 2017-05-01 MED ORDER — SUMATRIPTAN SUCCINATE 6 MG/0.5ML ~~LOC~~ SOLN
6.0000 mg | Freq: Once | SUBCUTANEOUS | Status: AC
  Administered 2017-05-01: 6 mg via SUBCUTANEOUS
  Filled 2017-05-01: qty 0.5

## 2017-05-01 NOTE — Discharge Instructions (Signed)
Use the prescription medicine as needed for headache, but do not drive or work when taking it.  Call the neurologist for follow-up appointment to be seen about the headache condition.

## 2017-05-01 NOTE — ED Notes (Signed)
Advised patient not to drive while taking prescription medication. Patient verbalized understanding.  

## 2017-05-01 NOTE — ED Triage Notes (Signed)
Patient complains of headache x 6 days. States nothing makes headache better. States light and movement makes it hurt worse. Sates history of migraines.

## 2017-05-01 NOTE — ED Provider Notes (Signed)
[facility] EMERGENCY DEPARTMENT Provider Note   CSN: [id number] Arrival date & time: 05/01/17  8315     History   Chief Complaint Chief Complaint  Patient presents with  . Migraine    HPI Michelle Gonzalez is a 42 y.o. female.  She presents for evaluation of headache, primarily right-sided, for 6 days.  She feels like her head is swollen on the right, and it hurts to touch that region.  She denies neck pain, fever, chills, nausea, vomiting, weakness or dizziness.  Has had similar headaches in the past, that she calls "migraine," and usually gets better with OTC medication.  She has tried Tylenol and ibuprofen without relief.  She has chronic low back pain, and has Percocet at home, but does not take it because it typically makes her nauseated and sometimes causes a headache.  There are no other known modifying factors.   HPI  Past Medical History:  Diagnosis Date  . Anxiety   . Dyspareunia 01/05/2014  . GERD (gastroesophageal reflux disease)   . Herniated disc   . Menstrual extraction 06/28/2013  . Other and unspecified ovarian cyst 03/30/2013  . Unspecified symptom associated with female genital organs 03/25/2013  . UTI (lower urinary tract infection)     Patient Active Problem List   Diagnosis Date Noted  . Patient desires pregnancy 10/03/2016  . Pregnancy examination or test, negative result 10/03/2016  . Spotting 10/03/2016  . Nausea 10/03/2016  . Dyspareunia 01/05/2014  . Anxiety 01/05/2014  . Menstrual extraction 06/28/2013  . Other and unspecified ovarian cyst 03/30/2013  . Unspecified symptom associated with female genital organs 03/25/2013    Past Surgical History:  Procedure Laterality Date  . CESAREAN SECTION    . LAPAROSCOPY    . WISDOM TOOTH EXTRACTION      OB History    Gravida Para Term Preterm AB Living   1 1       1    SAB TAB Ectopic Multiple Live Births           1       Home Medications    Prior to Admission medications     Medication Sig Start Date End Date Taking? Authorizing Provider  ibuprofen (ADVIL,MOTRIN) 800 MG tablet Take 1 tablet by mouth every 8 (eight) hours as needed. 04/30/17 05/30/17 Yes [provider]  norethindrone-ethinyl estradiol (JUNEL FE 1/20) 1-20 MG-MCG tablet Take 1 tablet by mouth daily. 12/20/16  Yes [provider]  ondansetron (ZOFRAN) 4 MG tablet Take 1 tablet by mouth daily as needed. 04/30/17 05/30/17 Yes [provider]  ALPRAZolam Duanne Moron) 0.5 MG tablet Takes half a tab prn. 02/08/13   [provider]  butalbital-acetaminophen-caffeine (FIORICET, ESGIC) 17-616-07 MG tablet Take 1-2 tablets by mouth every 6 (six) hours as needed for headache. 05/01/17 05/01/18  Daleen Bo, MD  fluconazole (DIFLUCAN) 150 MG tablet 150 mg as needed.  12/23/12   [provider]  FLUoxetine (PROZAC) 10 MG tablet Take 10 mg by mouth daily.    [provider]  metroNIDAZOLE (FLAGYL) 500 MG tablet Take 1 tablet (500 mg total) by mouth 2 (two) times daily. 03/28/17   Estill Dooms, NP  oxyCODONE-acetaminophen (PERCOCET/ROXICET) 5-325 MG tablet Take by mouth. Takes half a tab prn    [provider]  prenatal vitamin w/FE, FA (PRENATAL 1 + 1) 27-1 MG TABS tablet Take 1 tablet by mouth daily at 12 noon. 10/03/16   Estill Dooms, NP  propranolol ER (INDERAL LA)  60 MG 24 hr capsule 60 mg daily.  06/06/15   [provider]  topiramate (TOPAMAX) 25 MG tablet 25 mg at bedtime.  06/07/15   [provider]  traZODone (DESYREL) 50 MG tablet 100 mg at bedtime.  03/09/13   [provider]    Family History Family History  Problem Relation Age of Onset  . Diabetes Father   . Cancer Maternal Grandfather   . Cancer Paternal Grandmother   . Cancer Paternal Grandfather     Social History Social History   Tobacco Use  . Smoking status: Never Smoker  . Smokeless tobacco: Never Used  Substance Use Topics  . Alcohol use: No   . Drug use: No     Allergies   Sulfa antibiotics   Review of Systems Review of Systems  All other systems reviewed and are negative.    Physical Exam Updated Vital Signs BP (!) 146/107 (BP Location: Left Arm)   Pulse 74   Temp 99 F (37.2 C) (Oral)   Resp 18   Ht 5\' 7"  (1.702 m)   Wt 68.5 kg (151 lb)   SpO2 100%   BMI 23.65 kg/m   Physical Exam  Constitutional: She is oriented to person, place, and time. She appears well-developed and well-nourished. No distress.  HENT:  Head: Normocephalic and atraumatic.  Eyes: Conjunctivae and EOM are normal. Pupils are equal, round, and reactive to light.  Neck: Normal range of motion and phonation normal. Neck supple. No tracheal deviation present.  No meningismus.  Cardiovascular: Normal rate and regular rhythm.  Pulmonary/Chest: Effort normal and breath sounds normal. She exhibits no tenderness.  Abdominal: Soft. She exhibits no distension. There is no tenderness. There is no guarding.  Musculoskeletal: Normal range of motion. She exhibits no edema or deformity.  Neurological: She is alert and oriented to person, place, and time. She exhibits normal muscle tone.  No dysarthria, aphasia or nystagmus.  Skin: Skin is warm and dry.  Psychiatric: She has a normal mood and affect. Her behavior is normal. Judgment and thought content normal.  Nursing note and vitals reviewed.    ED Treatments / Results  Labs (all labs ordered are listed, but only abnormal results are displayed) Labs Reviewed - No data to display  EKG  EKG Interpretation None       Radiology Ct Head Wo Contrast  Result Date: 05/01/2017 CLINICAL DATA:  Right temporal headaches. EXAM: CT HEAD WITHOUT CONTRAST TECHNIQUE: Contiguous axial images were obtained from the base of the skull through the vertex without intravenous contrast. COMPARISON:  None. FINDINGS: Brain: No acute infarct, hemorrhage, or mass lesion is present. The ventricles are of normal size.  No significant extraaxial fluid collection is present. No significant white matter disease is present. Brainstem and cerebellum are normal. Vascular: No hyperdense vessel or unexpected calcification. Skull: Calvarium is intact. No focal lytic or blastic lesions are present. Sinuses/Orbits: The paranasal sinuses and mastoid air cells are clear. Globes and orbits are within normal limits. IMPRESSION: Negative CT of the head. Electronically Signed   By: San Morelle M.D.   On: 05/01/2017 13:11    Procedures Procedures (including critical care time)  Medications Ordered in ED Medications  SUMAtriptan (IMITREX) injection 6 mg (6 mg Subcutaneous Given 05/01/17 1213)     Initial Impression / Assessment and Plan / ED Course  I have reviewed the triage vital signs and the nursing notes.  Pertinent labs & imaging results that were available during my care  of the patient were reviewed by me and considered in my medical decision making (see chart for details).      Patient Vitals for the past 24 hrs:  BP Temp Temp src Pulse Resp SpO2 Height Weight  05/01/17 0950 - - - - - - 5\' 7"  (1.702 m) 68.5 kg (151 lb)  05/01/17 0949 (!) 146/107 99 F (37.2 C) Oral 74 18 100 % - -    1:39 PM Reevaluation with update and discussion. After initial assessment and treatment, an updated evaluation reveals she did not get improvement with the Imitrex.  Findings discussed with the patient and all questions were answered. Daleen Bo      Final Clinical Impressions(s) / ED Diagnoses   Final diagnoses:  Nonintractable headache, unspecified chronicity pattern, unspecified headache type    Nonspecific headache, doubt migraine, suspect tension related. Doubt intracranial tumor, meningitis, cervical radiculopathy.  Nursing Notes Reviewed/ Care Coordinated Applicable Imaging Reviewed Interpretation of Laboratory Data incorporated into ED treatment  The patient appears reasonably screened and/or  stabilized for discharge and I doubt any other medical condition or other [facility] requiring further screening, evaluation, or treatment in the ED at this time prior to discharge.  Plan: Home Medications-continue usual medications; Home Treatments-rest, fluids; return here if the recommended treatment, does not improve the symptoms; Recommended follow up-PCP, as needed.  Neurology follow-up for headache evaluation.    ED Discharge Orders        Ordered    butalbital-acetaminophen-caffeine (FIORICET, ESGIC) 50-325-40 MG tablet  Every 6 hours PRN     05/01/17 1338       Daleen Bo, MD 05/01/17 1340

## 2017-06-02 ENCOUNTER — Encounter (INDEPENDENT_AMBULATORY_CARE_PROVIDER_SITE_OTHER): Payer: Self-pay

## 2017-06-02 ENCOUNTER — Ambulatory Visit: Payer: BLUE CROSS/BLUE SHIELD | Admitting: Adult Health

## 2017-06-02 ENCOUNTER — Encounter: Payer: Self-pay | Admitting: Adult Health

## 2017-06-02 VITALS — BP 120/72 | HR 86 | Ht 67.0 in | Wt 153.0 lb

## 2017-06-02 DIAGNOSIS — N898 Other specified noninflammatory disorders of vagina: Secondary | ICD-10-CM | POA: Diagnosis not present

## 2017-06-02 DIAGNOSIS — Z113 Encounter for screening for infections with a predominantly sexual mode of transmission: Secondary | ICD-10-CM | POA: Diagnosis not present

## 2017-06-02 LAB — POCT WET PREP (WET MOUNT): WBC, Wet Prep HPF POC: POSITIVE

## 2017-06-02 NOTE — Progress Notes (Signed)
Subjective:     Patient ID: Michelle Gonzalez, female   DOB: 1976-03-16, 42 y.o.   MRN: [id number]  HPI Michelle Gonzalez is a 42 year old white female in complaining of vaginal discharge, no odor, took diflucan, and wants STD testing.   Review of Systems +vaginal discharge Reviewed past medical,surgical, social and family history. Reviewed medications and allergies.     Objective:   Physical Exam BP 120/72 (BP Location: Left Arm, Patient Position: Sitting, Cuff Size: Normal)   Pulse 86   Ht 5\' 7"  (1.702 m)   Wt 153 lb (69.4 kg)   BMI 23.96 kg/m  Skin warm and dry.Pelvic: external genitalia is normal in appearance no lesions, vagina: white discharge without odor,urethra has no lesions or masses noted, cervix:smooth and bulbous, uterus: normal size, shape and contour, non tender, no masses felt, adnexa: no masses or tenderness noted. Bladder is non tender and no masses felt. Wet prep:  +WBCs. Nuswab obtained    Assessment:     1. Vaginal discharge   2. Screening examination for STD (sexually transmitted disease)       Plan:     Nuswab sent Return in 4 weeks for pap and physical

## 2017-06-04 ENCOUNTER — Telehealth: Payer: Self-pay | Admitting: Adult Health

## 2017-06-04 LAB — NUSWAB VAGINITIS PLUS (VG+)
Candida albicans, NAA: NEGATIVE
Candida glabrata, NAA: NEGATIVE
Chlamydia trachomatis, NAA: NEGATIVE
Neisseria gonorrhoeae, NAA: NEGATIVE
Trich vag by NAA: NEGATIVE

## 2017-06-04 NOTE — Telephone Encounter (Signed)
Patient called stating that she had some blood work done, and she would like a call back regarding her results.

## 2017-06-04 NOTE — Telephone Encounter (Signed)
Pt wanted results from vaginal swabs. All tests negative. Patient informed.

## 2017-06-05 ENCOUNTER — Telehealth: Payer: Self-pay | Admitting: Adult Health

## 2017-06-05 NOTE — Telephone Encounter (Signed)
Left message that nuswab all negative  

## 2017-06-26 DIAGNOSIS — M47816 Spondylosis without myelopathy or radiculopathy, lumbar region: Secondary | ICD-10-CM | POA: Diagnosis not present

## 2017-06-26 DIAGNOSIS — M5136 Other intervertebral disc degeneration, lumbar region: Secondary | ICD-10-CM | POA: Diagnosis not present

## 2017-07-01 ENCOUNTER — Other Ambulatory Visit (HOSPITAL_COMMUNITY)
Admission: RE | Admit: 2017-07-01 | Discharge: 2017-07-01 | Disposition: A | Discharge status: Home / Self Care | Payer: BLUE CROSS/BLUE SHIELD | Source: Ambulatory Visit | Attending: Adult Health | Admitting: Adult Health

## 2017-07-01 ENCOUNTER — Encounter: Payer: Self-pay | Admitting: Adult Health

## 2017-07-01 ENCOUNTER — Ambulatory Visit (INDEPENDENT_AMBULATORY_CARE_PROVIDER_SITE_OTHER): Payer: BLUE CROSS/BLUE SHIELD | Admitting: Adult Health

## 2017-07-01 VITALS — BP 136/90 | HR 77 | Ht 65.0 in | Wt 149.0 lb

## 2017-07-01 DIAGNOSIS — Z1211 Encounter for screening for malignant neoplasm of colon: Secondary | ICD-10-CM | POA: Diagnosis not present

## 2017-07-01 DIAGNOSIS — Z01419 Encounter for gynecological examination (general) (routine) without abnormal findings: Secondary | ICD-10-CM

## 2017-07-01 DIAGNOSIS — Z1212 Encounter for screening for malignant neoplasm of rectum: Secondary | ICD-10-CM

## 2017-07-01 DIAGNOSIS — Z3041 Encounter for surveillance of contraceptive pills: Secondary | ICD-10-CM

## 2017-07-01 LAB — HEMOCCULT GUIAC POC 1CARD (OFFICE): Fecal Occult Blood, POC: NEGATIVE

## 2017-07-01 MED ORDER — FLUCONAZOLE 150 MG PO TABS: ORAL_TABLET | ORAL | 6 refills | Status: DC

## 2017-07-01 MED ORDER — NORETHIN ACE-ETH ESTRAD-FE 1-20 MG-MCG PO TABS: 1.0000 | ORAL_TABLET | Freq: Every day | ORAL | 4 refills | Status: DC

## 2017-07-01 NOTE — Progress Notes (Signed)
Patient ID: Michelle Gonzalez, female   DOB: 18-Jul-1975, 42 y.o.   MRN: [id number] History of Present Illness: Lilou is a 42 year old white female,divorced, in for a well woman gyn exam and pap. PCP is Dr Quintin Alto.   Current Medications, Allergies, Past Medical History, Past Surgical History, Family History and Social History were reviewed in [employer] record.     Review of Systems: Patient denies any headaches, hearing loss, fatigue, blurred vision, shortness of breath, chest pain, abdominal pain, problems with bowel movements, urination, or intercourse. No joint pain or mood swings. Vaginal discharge at times.She is happy with her OCs.    Physical Exam:BP 136/90 (BP Location: Left Arm, Patient Position: Sitting, Cuff Size: Normal)   Pulse 77   Ht 5\' 5"  (1.651 m)   Wt 149 lb (67.6 kg)   BMI 24.79 kg/m  General:  Well developed, well nourished, no acute distress Skin:  Warm and dry Neck:  Midline trachea, normal thyroid, good ROM, no lymphadenopathy Lungs; Clear to auscultation bilaterally Breast:  No dominant palpable mass, retraction, or nipple discharge Cardiovascular: Regular rate and rhythm Abdomen:  Soft, non tender, no hepatosplenomegaly Pelvic:  External genitalia is normal in appearance, no lesions.  The vagina is normal in appearance. Urethra has no lesions or masses. The cervix is bulbous. Pap with HPV performed. Uterus is felt to be normal size, shape, and contour.  No adnexal masses or tenderness noted.Bladder is non tender, no masses felt. Rectal: Good sphincter tone, no polyps, or hemorrhoids felt.  Hemoccult negative. Extremities/musculoskeletal:  No swelling or varicosities noted, no clubbing or cyanosis Psych:  No mood changes, alert and cooperative,seems happy PHQ 2 score 0.She requested refills on diflucan too.   Impression: 1. Encounter for gynecological examination with Papanicolaou smear of cervix   2. Screening for colorectal cancer    3. Encounter for surveillance of contraceptive pills       Plan: Physical; in 1 year Pap in 3 if normal Get mammogram now and yearly Fasting labs in near future Meds ordered this encounter  Medications  . norethindrone-ethinyl estradiol (JUNEL FE 1/20) 1-20 MG-MCG tablet    Sig: Take 1 tablet by mouth daily.    Dispense:  3 Package    Refill:  4    Order Specific Question:   Supervising Provider    Answer:   Elonda Husky, LUTHER H [[id number]]  . fluconazole (DIFLUCAN) 150 MG tablet    Sig: Take 1 and repeat 1 in 3 days if needed    Dispense:  2 tablet    Refill:  6    Order Specific Question:   Supervising Provider    Answer:   Tania Ade H [[id number]]

## 2017-07-03 LAB — CYTOLOGY - PAP
Adequacy: ABSENT — AB
HPV: DETECTED — AB

## 2017-07-04 ENCOUNTER — Telehealth: Payer: Self-pay | Admitting: Adult Health

## 2017-07-04 ENCOUNTER — Encounter: Payer: Self-pay | Admitting: Adult Health

## 2017-07-04 DIAGNOSIS — R8789 Other abnormal findings in specimens from female genital organs: Secondary | ICD-10-CM

## 2017-07-04 DIAGNOSIS — R87618 Other abnormal cytological findings on specimens from cervix uteri: Secondary | ICD-10-CM

## 2017-07-04 HISTORY — DX: Other abnormal cytological findings on specimens from cervix uteri: R87.618

## 2017-07-04 NOTE — Telephone Encounter (Signed)
Pt aware pap was LSIL with +HPV, will get colpo with Dr Glo Herring

## 2017-07-10 ENCOUNTER — Other Ambulatory Visit: Payer: Self-pay | Admitting: Obstetrics and Gynecology

## 2017-07-10 DIAGNOSIS — N870 Mild cervical dysplasia: Secondary | ICD-10-CM | POA: Diagnosis not present

## 2017-07-11 ENCOUNTER — Ambulatory Visit: Payer: BLUE CROSS/BLUE SHIELD | Admitting: Obstetrics and Gynecology

## 2017-07-11 ENCOUNTER — Encounter: Payer: Self-pay | Admitting: Obstetrics and Gynecology

## 2017-07-11 VITALS — BP 120/90 | HR 98 | Ht 67.0 in | Wt 150.8 lb

## 2017-07-11 DIAGNOSIS — R87612 Low grade squamous intraepithelial lesion on cytologic smear of cervix (LGSIL): Secondary | ICD-10-CM | POA: Diagnosis not present

## 2017-07-11 DIAGNOSIS — Z3202 Encounter for pregnancy test, result negative: Secondary | ICD-10-CM | POA: Diagnosis not present

## 2017-07-11 DIAGNOSIS — R87820 Cervical low risk human papillomavirus (HPV) DNA test positive: Secondary | ICD-10-CM | POA: Diagnosis not present

## 2017-07-11 DIAGNOSIS — R8789 Other abnormal findings in specimens from female genital organs: Secondary | ICD-10-CM

## 2017-07-11 DIAGNOSIS — R87618 Other abnormal cytological findings on specimens from cervix uteri: Secondary | ICD-10-CM

## 2017-07-11 LAB — POCT URINE PREGNANCY: Preg Test, Ur: NEGATIVE

## 2017-07-11 NOTE — Progress Notes (Addendum)
Michelle Gonzalez 42 y.o. [id number] here for colposcopy for low-grade squamous intraepithelial neoplasia (LGSIL - encompassing HPV,mild dysplasia,CIN I) pap smear on 07/01/2017. Discussed role for HPV in cervical dysplasia, need for surveillance. She notes that she is recently divorced of 1 year and has a new partner and voices what the concern for that is. She reports that she has had one child via C-section who is 45 and at [employer] to become a Clinical biochemist. She denies any other symptoms. She is currently taking birth control at this time.   Discussion: Discussed with pt progression of abnormal pap smear results. Pt advised that low risk +HPV, ASCUS and LSIL typically revert back to normal cells and treatment plan is typically yearly surveillance for 3 years. Pt advised that colposcopy is typically only indicated with ASCUS and LSIL with +HPV. Discussed with pt that high grade CIN II and III require treatment. Pt advised that it typically takes 5 years or more to go from high grade abnormality to cervical cancer.   At end of discussion, pt had opportunity to ask questions and has no further questions at this time.   Specific discussion of HPV as noted above. Patient given Krames booklets. Greater than 50% was spent in counseling and coordination of care with the patient.   Total time greater than: 25 minutes.     Patient given informed consent, signed copy in the chart, time out was performed.  Placed in lithotomy position. Cervix viewed with speculum and colposcope after application of acetic acid.   Colposcopy adequate? Yes Findings: Squamous metaplasia. Transition zone. No atypical vessels and no white patches.; biopsies obtained at: Posterior lip biopsy x 2. Anterior lip biopsy x 1.  ECC specimen obtained.  All specimens were labelled and sent to pathology.    Colposcopy IMPRESSION:no visible dysplasia  Patient was given post procedure instructions. Will follow up pathology and manage  accordingly. Routine preventative health maintenance measures emphasized. If no lesions worse than CIN I will just repeat pap in a year.  By signing my name below, I, Soijett Blue, attest that this documentation has been prepared under the direction and in the presence of Jonnie Kind, MD. Electronically Signed: Soijett Blue, Scribe. 07/11/17. 9:25 AM.  I personally performed the services described in this documentation, which was SCRIBED in my presence. The recorded information has been reviewed and considered accurate. It has been edited as necessary during review. Jonnie Kind, MD

## 2017-07-11 NOTE — Patient Instructions (Signed)
You can go to http://www.wolf.info/ for further information on HPV.

## 2017-07-15 ENCOUNTER — Telehealth: Payer: Self-pay | Admitting: Obstetrics and Gynecology

## 2017-07-15 NOTE — Telephone Encounter (Signed)
I had a several minute conversation with Ms. Michelle Gonzalez today explaining her biopsy report.. The results were CIN-1 on the anterior lip of the cervix and CIN-1-2 on the posterior lip of the cervix. I distinctly recall with the biopsies of the anterior lip of the cervix with a larger biopsies and the posterior lip biopsies were slightly less well-developed.  I consider the posterior lip biopsy is adequate. I explained to Michelle Gonzalez the grading scale of CIN-1-2-3 and she states she has been doing some reading on the colposcopy and its range of results. Given that this is a new onset of abnormality after long pattern of normal Paps, in the situation of having had a new partner recently that I think that follow-up in 1 year, treating this as a CIN-1, is reasonable.  I have emphasized the importance of regular follow-up in the future..  I have offered to meet with the patient she has any further questions by appointment  Current plan is for repeat Pap smear in 1 year, consideration of colposcopy at that time

## 2017-07-30 DIAGNOSIS — G894 Chronic pain syndrome: Secondary | ICD-10-CM | POA: Diagnosis not present

## 2017-07-30 DIAGNOSIS — M47812 Spondylosis without myelopathy or radiculopathy, cervical region: Secondary | ICD-10-CM | POA: Diagnosis not present

## 2017-07-30 DIAGNOSIS — M5136 Other intervertebral disc degeneration, lumbar region: Secondary | ICD-10-CM | POA: Diagnosis not present

## 2017-07-30 DIAGNOSIS — M47816 Spondylosis without myelopathy or radiculopathy, lumbar region: Secondary | ICD-10-CM | POA: Diagnosis not present

## 2017-07-30 DIAGNOSIS — M47892 Other spondylosis, cervical region: Secondary | ICD-10-CM | POA: Diagnosis not present

## 2017-08-26 DIAGNOSIS — M545 Low back pain: Secondary | ICD-10-CM | POA: Diagnosis not present

## 2017-08-26 DIAGNOSIS — Z1389 Encounter for screening for other disorder: Secondary | ICD-10-CM | POA: Diagnosis not present

## 2017-08-26 DIAGNOSIS — G43009 Migraine without aura, not intractable, without status migrainosus: Secondary | ICD-10-CM | POA: Diagnosis not present

## 2017-08-26 DIAGNOSIS — Z1331 Encounter for screening for depression: Secondary | ICD-10-CM | POA: Diagnosis not present

## 2017-08-26 DIAGNOSIS — F5101 Primary insomnia: Secondary | ICD-10-CM | POA: Diagnosis not present

## 2017-08-26 DIAGNOSIS — F411 Generalized anxiety disorder: Secondary | ICD-10-CM | POA: Diagnosis not present

## 2017-09-04 DIAGNOSIS — M5136 Other intervertebral disc degeneration, lumbar region: Secondary | ICD-10-CM | POA: Diagnosis not present

## 2017-09-04 DIAGNOSIS — M47816 Spondylosis without myelopathy or radiculopathy, lumbar region: Secondary | ICD-10-CM | POA: Diagnosis not present

## 2017-09-22 DIAGNOSIS — L247 Irritant contact dermatitis due to plants, except food: Secondary | ICD-10-CM | POA: Diagnosis not present

## 2017-09-22 DIAGNOSIS — J069 Acute upper respiratory infection, unspecified: Secondary | ICD-10-CM | POA: Diagnosis not present

## 2017-09-22 DIAGNOSIS — Z6824 Body mass index (BMI) 24.0-24.9, adult: Secondary | ICD-10-CM | POA: Diagnosis not present

## 2017-09-22 DIAGNOSIS — N3001 Acute cystitis with hematuria: Secondary | ICD-10-CM | POA: Diagnosis not present

## 2017-10-15 DIAGNOSIS — G894 Chronic pain syndrome: Secondary | ICD-10-CM | POA: Diagnosis not present

## 2017-10-15 DIAGNOSIS — M5136 Other intervertebral disc degeneration, lumbar region: Secondary | ICD-10-CM | POA: Diagnosis not present

## 2017-10-15 DIAGNOSIS — M545 Low back pain: Secondary | ICD-10-CM | POA: Diagnosis not present

## 2017-10-15 DIAGNOSIS — M47816 Spondylosis without myelopathy or radiculopathy, lumbar region: Secondary | ICD-10-CM | POA: Diagnosis not present

## 2017-10-23 DIAGNOSIS — M5137 Other intervertebral disc degeneration, lumbosacral region: Secondary | ICD-10-CM | POA: Diagnosis not present

## 2017-10-23 DIAGNOSIS — M5136 Other intervertebral disc degeneration, lumbar region: Secondary | ICD-10-CM | POA: Diagnosis not present

## 2017-11-14 DIAGNOSIS — M47816 Spondylosis without myelopathy or radiculopathy, lumbar region: Secondary | ICD-10-CM | POA: Diagnosis not present

## 2017-11-14 DIAGNOSIS — G44229 Chronic tension-type headache, not intractable: Secondary | ICD-10-CM | POA: Diagnosis not present

## 2017-11-14 DIAGNOSIS — M5136 Other intervertebral disc degeneration, lumbar region: Secondary | ICD-10-CM | POA: Diagnosis not present

## 2017-11-14 DIAGNOSIS — M545 Low back pain: Secondary | ICD-10-CM | POA: Diagnosis not present

## 2017-11-14 DIAGNOSIS — M47812 Spondylosis without myelopathy or radiculopathy, cervical region: Secondary | ICD-10-CM | POA: Diagnosis not present

## 2017-11-25 DIAGNOSIS — Z882 Allergy status to sulfonamides status: Secondary | ICD-10-CM | POA: Diagnosis not present

## 2017-11-25 DIAGNOSIS — Z79899 Other long term (current) drug therapy: Secondary | ICD-10-CM | POA: Diagnosis not present

## 2017-11-25 DIAGNOSIS — M5136 Other intervertebral disc degeneration, lumbar region: Secondary | ICD-10-CM | POA: Diagnosis not present

## 2017-11-25 DIAGNOSIS — M545 Low back pain: Secondary | ICD-10-CM | POA: Diagnosis not present

## 2017-11-25 DIAGNOSIS — M47816 Spondylosis without myelopathy or radiculopathy, lumbar region: Secondary | ICD-10-CM | POA: Diagnosis not present

## 2017-11-25 DIAGNOSIS — G894 Chronic pain syndrome: Secondary | ICD-10-CM | POA: Diagnosis not present

## 2017-11-25 DIAGNOSIS — M47812 Spondylosis without myelopathy or radiculopathy, cervical region: Secondary | ICD-10-CM | POA: Diagnosis not present

## 2017-12-16 DIAGNOSIS — M47816 Spondylosis without myelopathy or radiculopathy, lumbar region: Secondary | ICD-10-CM | POA: Diagnosis not present

## 2017-12-16 DIAGNOSIS — M546 Pain in thoracic spine: Secondary | ICD-10-CM | POA: Diagnosis not present

## 2017-12-16 DIAGNOSIS — M5137 Other intervertebral disc degeneration, lumbosacral region: Secondary | ICD-10-CM | POA: Diagnosis not present

## 2017-12-16 DIAGNOSIS — M5442 Lumbago with sciatica, left side: Secondary | ICD-10-CM | POA: Diagnosis not present

## 2017-12-16 DIAGNOSIS — M5136 Other intervertebral disc degeneration, lumbar region: Secondary | ICD-10-CM | POA: Diagnosis not present

## 2017-12-16 DIAGNOSIS — M549 Dorsalgia, unspecified: Secondary | ICD-10-CM | POA: Diagnosis not present

## 2017-12-23 DIAGNOSIS — M545 Low back pain: Secondary | ICD-10-CM | POA: Diagnosis not present

## 2017-12-23 DIAGNOSIS — M47816 Spondylosis without myelopathy or radiculopathy, lumbar region: Secondary | ICD-10-CM | POA: Diagnosis not present

## 2017-12-23 DIAGNOSIS — G894 Chronic pain syndrome: Secondary | ICD-10-CM | POA: Diagnosis not present

## 2017-12-31 DIAGNOSIS — M47816 Spondylosis without myelopathy or radiculopathy, lumbar region: Secondary | ICD-10-CM | POA: Diagnosis not present

## 2017-12-31 DIAGNOSIS — M545 Low back pain: Secondary | ICD-10-CM | POA: Diagnosis not present

## 2018-01-28 DIAGNOSIS — Z6824 Body mass index (BMI) 24.0-24.9, adult: Secondary | ICD-10-CM | POA: Diagnosis not present

## 2018-01-28 DIAGNOSIS — G43009 Migraine without aura, not intractable, without status migrainosus: Secondary | ICD-10-CM | POA: Diagnosis not present

## 2018-01-28 DIAGNOSIS — R350 Frequency of micturition: Secondary | ICD-10-CM | POA: Diagnosis not present

## 2018-02-10 DIAGNOSIS — M47816 Spondylosis without myelopathy or radiculopathy, lumbar region: Secondary | ICD-10-CM | POA: Diagnosis not present

## 2018-02-24 DIAGNOSIS — M47816 Spondylosis without myelopathy or radiculopathy, lumbar region: Secondary | ICD-10-CM | POA: Diagnosis not present

## 2018-02-24 DIAGNOSIS — M545 Low back pain: Secondary | ICD-10-CM | POA: Diagnosis not present

## 2018-02-24 DIAGNOSIS — G894 Chronic pain syndrome: Secondary | ICD-10-CM | POA: Diagnosis not present

## 2018-02-25 DIAGNOSIS — G43009 Migraine without aura, not intractable, without status migrainosus: Secondary | ICD-10-CM | POA: Diagnosis not present

## 2018-02-25 DIAGNOSIS — F411 Generalized anxiety disorder: Secondary | ICD-10-CM | POA: Diagnosis not present

## 2018-02-25 DIAGNOSIS — M545 Low back pain: Secondary | ICD-10-CM | POA: Diagnosis not present

## 2018-02-25 DIAGNOSIS — F5101 Primary insomnia: Secondary | ICD-10-CM | POA: Diagnosis not present

## 2018-03-19 DIAGNOSIS — M545 Low back pain: Secondary | ICD-10-CM | POA: Diagnosis not present

## 2018-03-19 DIAGNOSIS — M47816 Spondylosis without myelopathy or radiculopathy, lumbar region: Secondary | ICD-10-CM | POA: Diagnosis not present

## 2018-03-19 DIAGNOSIS — M5136 Other intervertebral disc degeneration, lumbar region: Secondary | ICD-10-CM | POA: Diagnosis not present

## 2018-03-19 DIAGNOSIS — G894 Chronic pain syndrome: Secondary | ICD-10-CM | POA: Diagnosis not present

## 2018-04-04 DIAGNOSIS — R1032 Left lower quadrant pain: Secondary | ICD-10-CM | POA: Diagnosis not present

## 2018-04-04 DIAGNOSIS — N132 Hydronephrosis with renal and ureteral calculous obstruction: Secondary | ICD-10-CM | POA: Diagnosis not present

## 2018-04-05 DIAGNOSIS — R1032 Left lower quadrant pain: Secondary | ICD-10-CM | POA: Diagnosis not present

## 2018-04-05 DIAGNOSIS — Z79899 Other long term (current) drug therapy: Secondary | ICD-10-CM | POA: Diagnosis not present

## 2018-04-05 DIAGNOSIS — N132 Hydronephrosis with renal and ureteral calculous obstruction: Secondary | ICD-10-CM | POA: Diagnosis not present

## 2018-04-06 DIAGNOSIS — R319 Hematuria, unspecified: Secondary | ICD-10-CM | POA: Diagnosis not present

## 2018-04-06 DIAGNOSIS — N202 Calculus of kidney with calculus of ureter: Secondary | ICD-10-CM | POA: Diagnosis not present

## 2018-04-06 DIAGNOSIS — R82998 Other abnormal findings in urine: Secondary | ICD-10-CM | POA: Diagnosis not present

## 2018-04-06 DIAGNOSIS — R350 Frequency of micturition: Secondary | ICD-10-CM | POA: Diagnosis not present

## 2018-04-10 ENCOUNTER — Other Ambulatory Visit: Payer: Self-pay

## 2018-04-10 ENCOUNTER — Observation Stay (HOSPITAL_COMMUNITY)
Admission: AD | Admit: 2018-04-10 | Discharge: 2018-04-11 | Disposition: A | Discharge status: Home / Self Care | Payer: BLUE CROSS/BLUE SHIELD | Attending: Urology | Admitting: Urology

## 2018-04-10 ENCOUNTER — Encounter (HOSPITAL_COMMUNITY): Payer: Self-pay | Admitting: General Practice

## 2018-04-10 DIAGNOSIS — N201 Calculus of ureter: Secondary | ICD-10-CM | POA: Diagnosis not present

## 2018-04-10 DIAGNOSIS — N200 Calculus of kidney: Secondary | ICD-10-CM | POA: Diagnosis not present

## 2018-04-10 DIAGNOSIS — R8271 Bacteriuria: Secondary | ICD-10-CM | POA: Diagnosis not present

## 2018-04-10 DIAGNOSIS — Z6825 Body mass index (BMI) 25.0-25.9, adult: Secondary | ICD-10-CM | POA: Diagnosis not present

## 2018-04-10 DIAGNOSIS — Z793 Long term (current) use of hormonal contraceptives: Secondary | ICD-10-CM | POA: Insufficient documentation

## 2018-04-10 DIAGNOSIS — J0101 Acute recurrent maxillary sinusitis: Secondary | ICD-10-CM | POA: Diagnosis not present

## 2018-04-10 DIAGNOSIS — Z882 Allergy status to sulfonamides status: Secondary | ICD-10-CM | POA: Diagnosis not present

## 2018-04-10 DIAGNOSIS — Z79899 Other long term (current) drug therapy: Secondary | ICD-10-CM | POA: Insufficient documentation

## 2018-04-10 LAB — CREATININE, SERUM
Creatinine, Ser: 1.1 mg/dL — ABNORMAL HIGH (ref 0.44–1.00)
GFR calc Af Amer: >60 mL/min (ref >60)
GFR calc non Af Amer: >60 mL/min (ref >60)

## 2018-04-10 LAB — CBC
HCT: 35.9 % — ABNORMAL LOW (ref 36.0–46.0)
Hemoglobin: 11.5 g/dL — ABNORMAL LOW (ref 12.0–15.0)
MCH: 30.4 pg (ref 26.0–34.0)
MCHC: 32 g/dL (ref 30.0–36.0)
MCV: 95 fL (ref 80.0–100.0)
Platelets: 268 10*3/uL (ref 150–400)
RBC: 3.78 MIL/uL — ABNORMAL LOW (ref 3.87–5.11)
RDW: 12.8 % (ref 11.5–15.5)
WBC: 4.7 10*3/uL (ref 4.0–10.5)
nRBC: 0 % (ref 0.0–0.2)

## 2018-04-10 MED ORDER — ENOXAPARIN SODIUM 40 MG/0.4ML ~~LOC~~ SOLN
40.0000 mg | SUBCUTANEOUS | Status: DC
  Administered 2018-04-10: 40 mg via SUBCUTANEOUS
  Filled 2018-04-10: qty 0.4

## 2018-04-10 MED ORDER — ONDANSETRON HCL 4 MG/2ML IJ SOLN: 4.0000 mg | INTRAMUSCULAR | Status: DC | PRN

## 2018-04-10 MED ORDER — HYDROMORPHONE HCL 1 MG/ML IJ SOLN
0.5000 mg | INTRAMUSCULAR | Status: DC | PRN
  Administered 2018-04-10 – 2018-04-11 (×3): 1 mg via INTRAVENOUS
  Filled 2018-04-10 (×3): qty 1

## 2018-04-10 MED ORDER — DEXTROSE-NACL 5-0.45 % IV SOLN
INTRAVENOUS | Status: DC
  Administered 2018-04-10: 18:00:00 via INTRAVENOUS

## 2018-04-10 MED ORDER — SENNA 8.6 MG PO TABS
1.0000 | ORAL_TABLET | Freq: Two times a day (BID) | ORAL | Status: DC
  Administered 2018-04-10: 8.6 mg via ORAL
  Filled 2018-04-10: qty 1

## 2018-04-10 MED ORDER — ACETAMINOPHEN 325 MG PO TABS: 650.0000 mg | ORAL_TABLET | ORAL | Status: DC | PRN

## 2018-04-10 MED ORDER — TRAZODONE HCL 50 MG PO TABS
50.0000 mg | ORAL_TABLET | Freq: Every day | ORAL | Status: DC
  Administered 2018-04-10: 50 mg via ORAL
  Filled 2018-04-10: qty 1

## 2018-04-10 MED ORDER — CEPHALEXIN 500 MG PO CAPS
500.0000 mg | ORAL_CAPSULE | Freq: Three times a day (TID) | ORAL | Status: DC
  Administered 2018-04-10 (×2): 500 mg via ORAL
  Filled 2018-04-10 (×3): qty 1

## 2018-04-10 MED ORDER — OXYCODONE HCL 5 MG PO TABS: 5.0000 mg | ORAL_TABLET | ORAL | Status: DC | PRN

## 2018-04-10 NOTE — H&P (Signed)
H&P  Chief Complaint:  Left flank pain  History of Present Illness: Michelle Gonzalez is a 42 y.o. year old female  Who presents at this time for admission to the hospital and eventual cystoscopy and left double-J stent placement.  She presented to our office on 12.27.2019  With a 5 day history of left flank pain.  Upon initiation of symptoms he went to the emergency room in [place], [place] where 6 mm left proximal ureteral stone was found.  In our office she was found to have non progression of this stone as well as bacteriuria.  There is no history of fever or recurrent urinary tract infection.  She is admitted his this time for pain management as well as eventual cystoscopy and double-J stent placement.  No past medical history on file.    Home Medications:  No medications prior to admission.    Allergies: Allergies not on file  No family history on file.  Social History:  has no history on file for tobacco, alcohol, and drug.  ROS: A complete review of systems was performed.  All systems are negative except for pertinent findings as noted.  Physical Exam:  Vital signs in last 24 hours: BP: ()/()  Arterial Line BP: ()/()  General:  Alert and oriented, No acute distress HEENT: Normocephalic, atraumatic Neck: No JVD or lymphadenopathy Cardiovascular: Regular rate and rhythm Lungs: Clear bilaterally Abdomen: Soft, nontender, nondistended, no abdominal masses Back: No CVA tenderness Extremities: No edema Neurologic: Grossly intact  Laboratory Data:  No results found for this or any previous visit (from the past 24 hour(s)). No results found for this or any previous visit (from the past 240 hour(s)). Creatinine: No results for input(s): CREATININE in the last 168 hours.  Radiologic Imaging: No results found.  Impression/Assessment:    Left upper ureteral stone, 6 mm with 9 mm left lower pole calculus  Plan:   admit for IV pain medication as well as eventual  cystoscopy and stent placement on 12.30.2019.  Lillette Boxer Teegan Brandis 04/10/2018, 5:02 PM  Lillette Boxer. [name] MD

## 2018-04-10 NOTE — Progress Notes (Signed)
Dr. Diona Fanti notified of patient's arrival to room 1421.  Dr. Diona Fanti to place orders.

## 2018-04-10 NOTE — Anesthesia Preprocedure Evaluation (Addendum)
Anesthesia Evaluation  Patient identified by MRN, date of birth, ID band Patient awake    Reviewed: Allergy & Precautions, Patient's Chart, lab work & pertinent test results  Airway Mallampati: II  TM Distance: >3 FB Neck ROM: Full    Dental no notable dental hx. (+) Teeth Intact, Dental Advisory Given   Pulmonary neg pulmonary ROS,    Pulmonary exam normal breath sounds clear to auscultation       Cardiovascular Exercise Tolerance: Good negative cardio ROS Normal cardiovascular exam Rhythm:Regular Rate:Normal     Neuro/Psych negative neurological ROS  negative psych ROS   GI/Hepatic negative GI ROS, Neg liver ROS,   Endo/Other  negative endocrine ROS  Renal/GU negative Renal ROS     Musculoskeletal negative musculoskeletal ROS (+)   Abdominal   Peds  Hematology negative hematology ROS (+)   Anesthesia Other Findings   Reproductive/Obstetrics                           Anesthesia Physical Anesthesia Plan  ASA: I  Anesthesia Plan: General   Post-op Pain Management:    Induction: Intravenous  PONV Risk Score and Plan: 3 and Treatment may vary due to age or medical condition, Ondansetron and Dexamethasone  Airway Management Planned: LMA  Additional Equipment:   Intra-op Plan:   Post-operative Plan:   Informed Consent: I have reviewed the patients History and Physical, chart, labs and discussed the procedure including the risks, benefits and alternatives for the proposed anesthesia with the patient or authorized representative who has indicated his/her understanding and acceptance.   Dental advisory given  Plan Discussed with: CRNA  Anesthesia Plan Comments:         Anesthesia Quick Evaluation

## 2018-04-10 NOTE — Progress Notes (Signed)
Patient reports being prescribed Cefzil by PCP today.  Patient requesting Trazodone at bedtime for sleep - reports taking at home.  Dr. Diona Fanti notified and gave order to continue with Keflex as ordered and gave order for Trazodone 50 mg qhs.

## 2018-04-11 ENCOUNTER — Observation Stay (HOSPITAL_COMMUNITY): Payer: BLUE CROSS/BLUE SHIELD | Admitting: Anesthesiology

## 2018-04-11 ENCOUNTER — Encounter (HOSPITAL_COMMUNITY): Payer: Self-pay | Admitting: *Deleted

## 2018-04-11 ENCOUNTER — Encounter (HOSPITAL_COMMUNITY)
Admission: AD | Disposition: A | Discharge status: Home / Self Care | Payer: Self-pay | Source: Home / Self Care | Attending: Urology

## 2018-04-11 ENCOUNTER — Observation Stay (HOSPITAL_COMMUNITY): Payer: BLUE CROSS/BLUE SHIELD

## 2018-04-11 DIAGNOSIS — Z79899 Other long term (current) drug therapy: Secondary | ICD-10-CM | POA: Diagnosis not present

## 2018-04-11 DIAGNOSIS — Z882 Allergy status to sulfonamides status: Secondary | ICD-10-CM | POA: Diagnosis not present

## 2018-04-11 DIAGNOSIS — N132 Hydronephrosis with renal and ureteral calculous obstruction: Secondary | ICD-10-CM | POA: Diagnosis not present

## 2018-04-11 DIAGNOSIS — Z793 Long term (current) use of hormonal contraceptives: Secondary | ICD-10-CM | POA: Diagnosis not present

## 2018-04-11 DIAGNOSIS — N201 Calculus of ureter: Secondary | ICD-10-CM | POA: Diagnosis not present

## 2018-04-11 HISTORY — PX: CYSTOSCOPY WITH STENT PLACEMENT: SHX5790

## 2018-04-11 LAB — HIV ANTIBODY (ROUTINE TESTING W REFLEX): HIV Screen 4th Generation wRfx: NONREACTIVE

## 2018-04-11 SURGERY — CYSTOSCOPY, WITH STENT INSERTION
Anesthesia: General | Site: Ureter | Laterality: Left

## 2018-04-11 MED ORDER — KETOROLAC TROMETHAMINE 30 MG/ML IJ SOLN: 30.0000 mg | Freq: Once | INTRAMUSCULAR | Status: DC | PRN

## 2018-04-11 MED ORDER — ACETAMINOPHEN 500 MG PO TABS
ORAL_TABLET | ORAL | Status: AC
  Administered 2018-04-11: 09:00:00
  Filled 2018-04-11: qty 2

## 2018-04-11 MED ORDER — ACETAMINOPHEN 500 MG PO TABS
1000.0000 mg | ORAL_TABLET | Freq: Once | ORAL | Status: AC
  Administered 2018-04-11: 1000 mg via ORAL

## 2018-04-11 MED ORDER — MIDAZOLAM HCL 2 MG/2ML IJ SOLN
INTRAMUSCULAR | Status: DC | PRN
  Administered 2018-04-11: 2 mg via INTRAVENOUS

## 2018-04-11 MED ORDER — FENTANYL CITRATE (PF) 100 MCG/2ML IJ SOLN
INTRAMUSCULAR | Status: DC | PRN
  Administered 2018-04-11: 100 ug via INTRAVENOUS

## 2018-04-11 MED ORDER — MENTHOL 3 MG MT LOZG
1.0000 | LOZENGE | OROMUCOSAL | Status: DC | PRN
  Filled 2018-04-11: qty 9

## 2018-04-11 MED ORDER — PROPOFOL 10 MG/ML IV BOLUS
INTRAVENOUS | Status: AC
  Filled 2018-04-11: qty 20

## 2018-04-11 MED ORDER — ONDANSETRON HCL 4 MG/2ML IJ SOLN: 4.0000 mg | Freq: Once | INTRAMUSCULAR | Status: DC | PRN

## 2018-04-11 MED ORDER — HYDROCODONE-ACETAMINOPHEN 7.5-325 MG PO TABS: 1.0000 | ORAL_TABLET | Freq: Once | ORAL | Status: DC | PRN

## 2018-04-11 MED ORDER — HYDROMORPHONE HCL 1 MG/ML IJ SOLN: 0.2500 mg | INTRAMUSCULAR | Status: DC | PRN

## 2018-04-11 MED ORDER — ONDANSETRON HCL 4 MG/2ML IJ SOLN
INTRAMUSCULAR | Status: DC | PRN
  Administered 2018-04-11: 4 mg via INTRAVENOUS

## 2018-04-11 MED ORDER — OXYBUTYNIN CHLORIDE 5 MG PO TABS: 5.0000 mg | ORAL_TABLET | Freq: Three times a day (TID) | ORAL | 1 refills | Status: DC | PRN

## 2018-04-11 MED ORDER — IOHEXOL 300 MG/ML  SOLN
INTRAMUSCULAR | Status: DC | PRN
  Administered 2018-04-11: 3 mL

## 2018-04-11 MED ORDER — KETOROLAC TROMETHAMINE 30 MG/ML IJ SOLN
30.0000 mg | Freq: Once | INTRAMUSCULAR | Status: AC
  Administered 2018-04-11: 30 mg via INTRAVENOUS
  Filled 2018-04-11: qty 1

## 2018-04-11 MED ORDER — DEXAMETHASONE SODIUM PHOSPHATE 10 MG/ML IJ SOLN
INTRAMUSCULAR | Status: DC | PRN
  Administered 2018-04-11: 10 mg via INTRAVENOUS

## 2018-04-11 MED ORDER — SODIUM CHLORIDE 0.9 % IR SOLN
Status: DC | PRN
  Administered 2018-04-11: 3000 mL

## 2018-04-11 MED ORDER — PROPOFOL 10 MG/ML IV BOLUS
INTRAVENOUS | Status: DC | PRN
  Administered 2018-04-11: 150 mg via INTRAVENOUS

## 2018-04-11 MED ORDER — MEPERIDINE HCL 50 MG/ML IJ SOLN: 6.2500 mg | INTRAMUSCULAR | Status: DC | PRN

## 2018-04-11 MED ORDER — SODIUM CHLORIDE 0.9 % IV SOLN
INTRAVENOUS | Status: DC | PRN
  Administered 2018-04-11: 10:00:00 via INTRAVENOUS

## 2018-04-11 MED ORDER — MIDAZOLAM HCL 2 MG/2ML IJ SOLN
INTRAMUSCULAR | Status: AC
  Filled 2018-04-11: qty 2

## 2018-04-11 MED ORDER — FENTANYL CITRATE (PF) 100 MCG/2ML IJ SOLN
INTRAMUSCULAR | Status: AC
  Filled 2018-04-11: qty 2

## 2018-04-11 MED ORDER — LIDOCAINE 2% (20 MG/ML) 5 ML SYRINGE
INTRAMUSCULAR | Status: DC | PRN
  Administered 2018-04-11: 100 mg via INTRAVENOUS

## 2018-04-11 SURGICAL SUPPLY — 14 items
BAG URO CATCHER STRL LF (MISCELLANEOUS) ×2 IMPLANT
CATH INTERMIT  6FR 70CM (CATHETERS) ×2 IMPLANT
CLOTH BEACON ORANGE TIMEOUT ST (SAFETY) ×2 IMPLANT
COVER WAND RF STERILE (DRAPES) IMPLANT
GLOVE BIOGEL M 8.0 STRL (GLOVE) ×2 IMPLANT
GOWN STRL REUS W/ TWL XL LVL3 (GOWN DISPOSABLE) ×1 IMPLANT
GOWN STRL REUS W/TWL LRG LVL3 (GOWN DISPOSABLE) ×2 IMPLANT
GOWN STRL REUS W/TWL XL LVL3 (GOWN DISPOSABLE) ×1
GUIDEWIRE ANG ZIPWIRE 038X150 (WIRE) IMPLANT
GUIDEWIRE STR DUAL SENSOR (WIRE) ×2 IMPLANT
MANIFOLD NEPTUNE II (INSTRUMENTS) ×2 IMPLANT
PACK CYSTO (CUSTOM PROCEDURE TRAY) ×2 IMPLANT
STENT URET 6FRX24 CONTOUR (STENTS) ×2 IMPLANT
TUBING CONNECTING 10 (TUBING) ×2 IMPLANT

## 2018-04-11 NOTE — Anesthesia Postprocedure Evaluation (Signed)
Anesthesia Post Note  Patient: Michelle Gonzalez  Procedure(s) Performed: CYSTOSCOPY WITH RETROGRADE AND LEFT STENT PLACEMENT (Left Ureter)     Patient location during evaluation: PACU Anesthesia Type: General Level of consciousness: awake and alert Pain management: pain level controlled Vital Signs Assessment: post-procedure vital signs reviewed and stable Respiratory status: spontaneous breathing, nonlabored ventilation, respiratory function stable and patient connected to nasal cannula oxygen Cardiovascular status: blood pressure returned to baseline and stable Postop Assessment: no apparent nausea or vomiting Anesthetic complications: no    Last Vitals:  Vitals:   04/11/18 1030 04/11/18 1045  BP: 113/80 107/77  Pulse: 67 66  Resp: 14 14  Temp:  36.7 C  SpO2: 99% 98%    Last Pain:  Vitals:   04/11/18 1030  TempSrc:   PainSc: 0-No pain                 Barnet Glasgow

## 2018-04-11 NOTE — Op Note (Signed)
Preoperative diagnosis: Obstructing, possibly infected left upper ureteral stone  Postoperative diagnosis: Same  Principal procedure: Cystoscopy, left retrograde ureteropyelogram, fluoroscopic interpretation, left double-J stent placement (6 [place] by 24 cm without tether)  Surgeon: Nalda Shackleford  Anesthesia: General with LMA  Complications: None  Specimen: None  Drains: Above mentioned stent  Estimated blood loss: None  Indications: 42 year old female presented yesterday with intractable pain from left upper ureteral stone.  She did have bacteria and mild to moderate amount of white cells present in her urine.  She was admitted for pain management, antibiotics and now stent placement.  The procedure as well as risks and complications have been discussed with the patient.  These include but are not limited to fever, infection, stent discomfort, ureteral injury, among others.  She understands these and desires to proceed.  Findings: Urethra was normal.  Bladder urothelium was normal.  Ureteral orifices were normal in configuration location.  Ureteropyelogram with Omnipaque was performed.  This revealed a normal distal and mid ureter.  At the UPJ, there was a filling defect consistent with the known ureteral stone.  There was proximal pyelocaliectasis, moderate to severe nature.  Description of procedure: The patient was properly identified and marked in the holding area.  She has been on oral antibiotics.  She was taken to the operating room where general anesthetic was administered with the LMA.  She was placed in the dorsolithotomy position.  Genitalia and perineum were prepped and draped.  Proper timeout was performed.  21 French panendoscope advanced into the bladder with cystoscopy/systematic bladder inspection performed.  The left ureteral orifice was cannulated with a 6 [place] open-ended catheter.  Gentle retrograde ureteropyelogram performed with Omnipaque with the above-mentioned  findings.  Following this, sensor tip guidewire was advanced through the open-ended catheter, with mild difficulty negotiating up past the obstructing upper ureteral stone.  Once a curl was seen in the upper pole calyceal system, the open-ended catheter was removed.  The 24 cm x 6 French contour double-J stent was passed over top of the guidewire, positioned adequately in the ureter using fluoroscopic and cystoscopic guidance, and deployed with removal of the guidewire.  Adequate curls were seen then using fluoroscopy and cystoscopy.  The bladder was drained.  The scope was removed.  The procedure was terminated.  The patient was then awakened and taken to the PACU in stable condition.  She tolerated the procedure well.

## 2018-04-11 NOTE — Anesthesia Procedure Notes (Signed)
Date/Time: 04/11/2018 9:55 AM Performed by: Cynda Familia, CRNA Oxygen Delivery Method: Simple face mask Placement Confirmation: positive ETCO2 and breath sounds checked- equal and bilateral Dental Injury: Teeth and Oropharynx as per pre-operative assessment

## 2018-04-11 NOTE — Progress Notes (Signed)
Day of Surgery Subjective: Patient reports no new problems overnight  Objective: Vital signs in last 24 hours: Temp:  [98.6 F (37 C)-99.1 F (37.3 C)] 99.1 F (37.3 C) (12/28 0605) Pulse Rate:  [72-86] 77 (12/28 0605) Resp:  [16-18] 16 (12/28 0605) BP: (113-133)/(87-98) 133/95 (12/28 0605) SpO2:  [97 %-100 %] 100 % (12/28 0605) Weight:  [69.4 kg] 69.4 kg (12/27 1726)  Intake/Output from previous day: No intake/output data recorded. Intake/Output this shift: No intake/output data recorded.  Physical Exam:  Constitutional: Vital signs reviewed. WD WN in NAD   Eyes: PERRL, No scleral icterus.   Cardiovascular: RRR Pulmonary/Chest: Normal effort   Lab Results: Recent Labs    04/10/18 1742  HGB 11.5*  HCT 35.9*   BMET Recent Labs    04/10/18 1742  CREATININE 1.10*   No results for input(s): LABPT, INR in the last 72 hours. No results for input(s): LABURIN in the last 72 hours. No results found for this or any previous visit.  Studies/Results: No results found.  Assessment/Plan:   Left upper ureteral stone--on for J2 stent today   LOS: 1 day   Michelle Gonzalez 04/11/2018, 7:41 AM

## 2018-04-11 NOTE — Anesthesia Procedure Notes (Signed)
Procedure Name: LMA Insertion Date/Time: 04/11/2018 9:39 AM Performed by: Cynda Familia, CRNA Pre-anesthesia Checklist: Patient identified, Emergency Drugs available, Suction available and Patient being monitored Patient Re-evaluated:Patient Re-evaluated prior to induction Oxygen Delivery Method: Circle System Utilized Preoxygenation: Pre-oxygenation with 100% oxygen Induction Type: IV induction Ventilation: Mask ventilation without difficulty LMA: LMA inserted LMA Size: 4.0 Tube type: Oral (20 cc air) Number of attempts: 1 Placement Confirmation: positive ETCO2 Tube secured with: Tape Dental Injury: Teeth and Oropharynx as per pre-operative assessment  Comments: IV induction Houser-- LMA insertion AM CRNA-- atraumatic-- teeth and mouth as preop-- bilat BS Houser

## 2018-04-11 NOTE — Discharge Summary (Signed)
Patient ID: AALYIAH CAMBEROS MRN: [id number] DOB/AGE: 1975-08-09 42 y.o.  Admit date: 04/10/2018 Discharge date: 04/11/2018  Primary Care Physician:  Manon Hilding, MD  Discharge Diagnoses:  Ureteral calculus Pyuria Present on Admission: . Left ureteral calculus   Consults:  None   Discharge Medications: Allergies as of 04/11/2018      Reactions   Sulfa Antibiotics    Mouth swelling      Medication List    STOP taking these medications   tamsulosin 0.4 MG Caps capsule Commonly known as:  FLOMAX     TAKE these medications   FLUoxetine 20 MG capsule Commonly known as:  PROZAC Take 20 mg by mouth daily.   ketorolac 10 MG tablet Commonly known as:  TORADOL Take 10 mg by mouth every 6 (six) hours as needed.   norethindrone-ethinyl estradiol 1-20 MG-MCG tablet Commonly known as:  JUNEL FE,GILDESS FE,LOESTRIN FE Take 1 tablet by mouth at bedtime.   oxybutynin 5 MG tablet Commonly known as:  DITROPAN Take 1 tablet (5 mg total) by mouth every 8 (eight) hours as needed for bladder spasms.   oxyCODONE-acetaminophen 5-325 MG tablet Commonly known as:  PERCOCET/ROXICET Take 1 tablet by mouth every 6 (six) hours as needed for moderate pain or severe pain.   SUMAtriptan 50 MG tablet Commonly known as:  IMITREX Take 50 mg by mouth every 2 (two) hours as needed for migraine. May repeat in 2 hours if headache persists or recurs.   topiramate 25 MG capsule Commonly known as:  TOPAMAX Take 25 mg by mouth 2 (two) times daily.   traZODone 50 MG tablet Commonly known as:  DESYREL Take 50 mg by mouth at bedtime.        Significant Diagnostic Studies:  Dg C-arm 1-60 Min-no Report  Result Date: 04/11/2018 Fluoroscopy was utilized by the requesting physician.  No radiographic interpretation.    Brief H and P: For complete details please refer to admission H and P, but in brief pt admitted for left proximal ureteral stone with intractable pain and pyuria--for IV abx,  pain meds and stenting  Hospital Course: Admitted 12.27 and underwent cysto/J2 stent on 12.28. She did well following stenting and was d/ced home after that on po cephalosporin. Active Problems:   Left ureteral calculus   Day of Discharge BP 122/85 (BP Location: Right Arm)   Pulse 74   Temp 98.1 F (36.7 C)   Resp 14   Ht 5\' 7"  (1.702 m)   Wt 69.4 kg   SpO2 99%   BMI 23.96 kg/m   No results found for this or any previous visit (from the past 24 hour(s)).  Physical Exam: General: Alert and awake oriented x3 not in any acute distress. HEENT: anicteric sclera, pupils reactive to light and accommodation CVS: S1-S2 clear no murmur rubs or gallops Chest: clear to auscultation bilaterally, no wheezing rales or rhonchi Abdomen: soft nontender, nondistended, normal bowel sounds, no organomegaly Extremities: no cyanosis, clubbing or edema noted bilaterally Neuro: Cranial nerves II-XII intact, no focal neurological deficits  Disposition:  Home  Diet:  Regular  Activity:  No limitaions   Disposition and Follow-up:  Discharge Instructions    Diet general   Complete by:  As directed    Increase activity slowly   Complete by:  As directed        Followup to be arranged  TESTS THAT NEED FOLLOW-UP   *Culture reult  DISCHARGE FOLLOW-UP  Follow-up Information    Franchot Gallo,  MD.   Specialty:  Urology Why:  we will call you to set up appt Contact information: [place] [address] [phone]           Time spent on Discharge:   10 mins  Signed: Lillette Boxer Jendaya Gossett 04/11/2018, 9:44 PM

## 2018-04-11 NOTE — Progress Notes (Signed)
Nurse reviewed discharge instructions with pt.  Pt verbalized understanding of discharge instructions, follow up appointment and new medication.  Prescription was electronically sent to pt's pharmacy of choice.  No concerns at time of discharge.

## 2018-04-11 NOTE — Discharge Instructions (Signed)
1. You may see some blood in the urine and may have some burning with urination for 48-72 hours. You also may notice that you have to urinate more frequently or urgently after your procedure which is normal.  2. You should call should you develop an inability urinate, fever > 101, persistent nausea and vomiting that prevents you from eating or drinking to stay hydrated.  3. If you have a stent, you will likely urinate more frequently and urgently until the stent is removed and you may experience some discomfort/pain in the lower abdomen and flank especially when urinating. You may take pain medication prescribed to you if needed for pain. You may also intermittently have blood in the urine until the stent is removed. 4. Continue full dose of the antibiotic given by your PCP

## 2018-04-11 NOTE — Transfer of Care (Signed)
Immediate Anesthesia Transfer of Care Note  Patient: Michelle Gonzalez  Procedure(s) Performed: CYSTOSCOPY WITH RETROGRADE AND LEFT STENT PLACEMENT (Left Ureter)  Patient Location: PACU  Anesthesia Type:General  Level of Consciousness: awake and alert   Airway & Oxygen Therapy: Patient Spontanous Breathing and Patient connected to face mask oxygen  Post-op Assessment: Report given to RN and Post -op Vital signs reviewed and stable  Post vital signs: Reviewed and stable  Last Vitals:  Vitals Value Taken Time  BP    Temp    Pulse    Resp    SpO2      Last Pain:  Vitals:   04/11/18 0815  TempSrc:   PainSc: 0-No pain      Patients Stated Pain Goal: 4 (22/48/25 0037)  Complications: No apparent anesthesia complications

## 2018-04-12 ENCOUNTER — Encounter (HOSPITAL_COMMUNITY): Payer: Self-pay | Admitting: Urology

## 2018-04-12 ENCOUNTER — Encounter: Payer: Self-pay | Admitting: Obstetrics and Gynecology

## 2018-04-16 DIAGNOSIS — R509 Fever, unspecified: Secondary | ICD-10-CM | POA: Diagnosis not present

## 2018-04-16 DIAGNOSIS — N200 Calculus of kidney: Secondary | ICD-10-CM | POA: Diagnosis not present

## 2018-04-16 DIAGNOSIS — J209 Acute bronchitis, unspecified: Secondary | ICD-10-CM | POA: Diagnosis not present

## 2018-04-16 DIAGNOSIS — R05 Cough: Secondary | ICD-10-CM | POA: Diagnosis not present

## 2018-04-17 DIAGNOSIS — N201 Calculus of ureter: Secondary | ICD-10-CM | POA: Diagnosis not present

## 2018-04-17 DIAGNOSIS — R8271 Bacteriuria: Secondary | ICD-10-CM | POA: Diagnosis not present

## 2018-04-20 ENCOUNTER — Encounter (HOSPITAL_COMMUNITY): Payer: Self-pay | Admitting: *Deleted

## 2018-04-20 ENCOUNTER — Other Ambulatory Visit: Payer: Self-pay | Admitting: Urology

## 2018-04-20 ENCOUNTER — Other Ambulatory Visit: Payer: Self-pay

## 2018-04-20 NOTE — Progress Notes (Signed)
Patient has noted cough over phone at preop phone call.  Patient states she has went to PCP twice and they state is only a cold.  Lungs clear.  Patient states not running a fever.  Instructed patient to keep Dr Diona Fanti informed regarding " cold" status.

## 2018-04-23 ENCOUNTER — Encounter (HOSPITAL_COMMUNITY): Payer: Self-pay | Admitting: *Deleted

## 2018-04-23 ENCOUNTER — Ambulatory Visit (HOSPITAL_COMMUNITY): Payer: BLUE CROSS/BLUE SHIELD

## 2018-04-23 ENCOUNTER — Ambulatory Visit (HOSPITAL_COMMUNITY): Payer: BLUE CROSS/BLUE SHIELD | Admitting: Anesthesiology

## 2018-04-23 ENCOUNTER — Ambulatory Visit (HOSPITAL_COMMUNITY)
Admission: RE | Admit: 2018-04-23 | Discharge: 2018-04-23 | Disposition: A | Discharge status: Home / Self Care | Payer: BLUE CROSS/BLUE SHIELD | Attending: Urology | Admitting: Urology

## 2018-04-23 ENCOUNTER — Encounter (HOSPITAL_COMMUNITY)
Admission: RE | Disposition: A | Discharge status: Home / Self Care | Payer: Self-pay | Source: Home / Self Care | Attending: Urology

## 2018-04-23 DIAGNOSIS — Z79899 Other long term (current) drug therapy: Secondary | ICD-10-CM | POA: Diagnosis not present

## 2018-04-23 DIAGNOSIS — F419 Anxiety disorder, unspecified: Secondary | ICD-10-CM | POA: Insufficient documentation

## 2018-04-23 DIAGNOSIS — G43909 Migraine, unspecified, not intractable, without status migrainosus: Secondary | ICD-10-CM | POA: Diagnosis not present

## 2018-04-23 DIAGNOSIS — Z793 Long term (current) use of hormonal contraceptives: Secondary | ICD-10-CM | POA: Diagnosis not present

## 2018-04-23 DIAGNOSIS — Z882 Allergy status to sulfonamides status: Secondary | ICD-10-CM | POA: Insufficient documentation

## 2018-04-23 DIAGNOSIS — N202 Calculus of kidney with calculus of ureter: Secondary | ICD-10-CM | POA: Diagnosis not present

## 2018-04-23 HISTORY — PX: CYSTOSCOPY/URETEROSCOPY/HOLMIUM LASER/STENT PLACEMENT: SHX6546

## 2018-04-23 LAB — PREGNANCY, URINE: Preg Test, Ur: NEGATIVE

## 2018-04-23 SURGERY — CYSTOSCOPY/URETEROSCOPY/HOLMIUM LASER/STENT PLACEMENT
Anesthesia: General | Site: Ureter | Laterality: Left

## 2018-04-23 MED ORDER — CEFAZOLIN SODIUM-DEXTROSE 2-4 GM/100ML-% IV SOLN
INTRAVENOUS | Status: AC
  Filled 2018-04-23: qty 100

## 2018-04-23 MED ORDER — PROPOFOL 10 MG/ML IV BOLUS
INTRAVENOUS | Status: AC
  Filled 2018-04-23: qty 20

## 2018-04-23 MED ORDER — LIDOCAINE HCL (CARDIAC) PF 100 MG/5ML IV SOSY
PREFILLED_SYRINGE | INTRAVENOUS | Status: DC | PRN
  Administered 2018-04-23: 60 mg via INTRAVENOUS

## 2018-04-23 MED ORDER — KETOROLAC TROMETHAMINE 30 MG/ML IJ SOLN
30.0000 mg | Freq: Once | INTRAMUSCULAR | Status: AC | PRN
  Administered 2018-04-23: 30 mg via INTRAVENOUS

## 2018-04-23 MED ORDER — PROPOFOL 10 MG/ML IV BOLUS
INTRAVENOUS | Status: DC | PRN
  Administered 2018-04-23: 20 mg via INTRAVENOUS
  Administered 2018-04-23: 120 mg via INTRAVENOUS

## 2018-04-23 MED ORDER — DEXAMETHASONE SODIUM PHOSPHATE 10 MG/ML IJ SOLN
INTRAMUSCULAR | Status: DC | PRN
  Administered 2018-04-23: 8 mg via INTRAVENOUS

## 2018-04-23 MED ORDER — FENTANYL CITRATE (PF) 100 MCG/2ML IJ SOLN
INTRAMUSCULAR | Status: AC
  Filled 2018-04-23: qty 2

## 2018-04-23 MED ORDER — SCOPOLAMINE 1 MG/3DAYS TD PT72
MEDICATED_PATCH | TRANSDERMAL | Status: DC | PRN
  Administered 2018-04-23: 1 via TRANSDERMAL

## 2018-04-23 MED ORDER — SCOPOLAMINE 1 MG/3DAYS TD PT72
MEDICATED_PATCH | TRANSDERMAL | Status: AC
  Filled 2018-04-23: qty 1

## 2018-04-23 MED ORDER — LACTATED RINGERS IV SOLN
INTRAVENOUS | Status: DC
  Administered 2018-04-23: 06:00:00 via INTRAVENOUS

## 2018-04-23 MED ORDER — MIDAZOLAM HCL 5 MG/5ML IJ SOLN
INTRAMUSCULAR | Status: DC | PRN
  Administered 2018-04-23: 2 mg via INTRAVENOUS

## 2018-04-23 MED ORDER — CEFAZOLIN SODIUM-DEXTROSE 2-4 GM/100ML-% IV SOLN: 2.0000 g | INTRAVENOUS | Status: DC

## 2018-04-23 MED ORDER — SODIUM CHLORIDE 0.9 % IR SOLN
Status: DC | PRN
  Administered 2018-04-23: 6000 mL

## 2018-04-23 MED ORDER — ONDANSETRON HCL 4 MG/2ML IJ SOLN
4.0000 mg | Freq: Once | INTRAMUSCULAR | Status: AC
  Administered 2018-04-23: 4 mg via INTRAVENOUS

## 2018-04-23 MED ORDER — LIDOCAINE HCL URETHRAL/MUCOSAL 2 % EX GEL
CUTANEOUS | Status: AC
Start: 2018-04-23 — End: ?
  Filled 2018-04-23: qty 5

## 2018-04-23 MED ORDER — ONDANSETRON HCL 4 MG/2ML IJ SOLN
INTRAMUSCULAR | Status: DC | PRN
  Administered 2018-04-23: 4 mg via INTRAVENOUS

## 2018-04-23 MED ORDER — PROMETHAZINE HCL 25 MG/ML IJ SOLN: 6.2500 mg | INTRAMUSCULAR | Status: DC | PRN

## 2018-04-23 MED ORDER — ACETAMINOPHEN 10 MG/ML IV SOLN: 1000.0000 mg | Freq: Once | INTRAVENOUS | Status: DC | PRN

## 2018-04-23 MED ORDER — FENTANYL CITRATE (PF) 100 MCG/2ML IJ SOLN
25.0000 ug | INTRAMUSCULAR | Status: DC | PRN
  Administered 2018-04-23 (×3): 50 ug via INTRAVENOUS

## 2018-04-23 MED ORDER — ONDANSETRON HCL 4 MG/2ML IJ SOLN
INTRAMUSCULAR | Status: AC
  Filled 2018-04-23: qty 2

## 2018-04-23 MED ORDER — CEPHALEXIN 500 MG PO CAPS: 500.0000 mg | ORAL_CAPSULE | Freq: Two times a day (BID) | ORAL | 0 refills | Status: DC

## 2018-04-23 MED ORDER — HYDROMORPHONE HCL 1 MG/ML IJ SOLN: 0.2500 mg | INTRAMUSCULAR | Status: DC | PRN

## 2018-04-23 MED ORDER — 0.9 % SODIUM CHLORIDE (POUR BTL) OPTIME
TOPICAL | Status: DC | PRN
  Administered 2018-04-23: 1000 mL

## 2018-04-23 MED ORDER — HYDROCODONE-ACETAMINOPHEN 7.5-325 MG PO TABS: 1.0000 | ORAL_TABLET | Freq: Once | ORAL | Status: DC | PRN

## 2018-04-23 MED ORDER — PROMETHAZINE HCL 25 MG/ML IJ SOLN
6.2500 mg | INTRAMUSCULAR | Status: DC | PRN
Start: 2018-04-23 — End: 2018-04-23

## 2018-04-23 MED ORDER — FENTANYL CITRATE (PF) 100 MCG/2ML IJ SOLN
INTRAMUSCULAR | Status: DC | PRN
  Administered 2018-04-23 (×6): 25 ug via INTRAVENOUS
  Administered 2018-04-23: 50 ug via INTRAVENOUS

## 2018-04-23 MED ORDER — KETOROLAC TROMETHAMINE 30 MG/ML IJ SOLN
INTRAMUSCULAR | Status: AC
  Filled 2018-04-23: qty 1

## 2018-04-23 MED ORDER — BELLADONNA ALKALOIDS-OPIUM 16.2-30 MG RE SUPP
RECTAL | Status: AC
Start: 2018-04-23 — End: ?
  Filled 2018-04-23: qty 1

## 2018-04-23 MED ORDER — CEFAZOLIN SODIUM-DEXTROSE 2-3 GM-%(50ML) IV SOLR
INTRAVENOUS | Status: DC | PRN
  Administered 2018-04-23: 2 g via INTRAVENOUS

## 2018-04-23 MED ORDER — MIDAZOLAM HCL 2 MG/2ML IJ SOLN
INTRAMUSCULAR | Status: AC
  Filled 2018-04-23: qty 2

## 2018-04-23 MED ORDER — MEPERIDINE HCL 50 MG/ML IJ SOLN: 6.2500 mg | INTRAMUSCULAR | Status: DC | PRN

## 2018-04-23 SURGICAL SUPPLY — 23 items
BAG URO CATCHER STRL LF (MISCELLANEOUS) ×2 IMPLANT
BASKET LASER NITINOL 1.9FR (BASKET) ×1 IMPLANT
BASKET ZERO TIP NITINOL 2.4FR (BASKET) IMPLANT
CATH INTERMIT  6FR 70CM (CATHETERS) ×1 IMPLANT
CLOTH BEACON ORANGE TIMEOUT ST (SAFETY) ×2 IMPLANT
COVER WAND RF STERILE (DRAPES) IMPLANT
EXTRACTOR STONE NITINOL NGAGE (UROLOGICAL SUPPLIES) ×1 IMPLANT
FIBER LASER FLEXIVA 365 (UROLOGICAL SUPPLIES) IMPLANT
FIBER LASER TRAC TIP (UROLOGICAL SUPPLIES) ×1 IMPLANT
GLOVE BIOGEL M 8.0 STRL (GLOVE) ×2 IMPLANT
GOWN STRL REUS W/ TWL XL LVL3 (GOWN DISPOSABLE) ×1 IMPLANT
GOWN STRL REUS W/TWL LRG LVL3 (GOWN DISPOSABLE) ×2 IMPLANT
GOWN STRL REUS W/TWL XL LVL3 (GOWN DISPOSABLE) ×1
GUIDEWIRE ANG ZIPWIRE 038X150 (WIRE) ×1 IMPLANT
GUIDEWIRE STR DUAL SENSOR (WIRE) ×2 IMPLANT
IV NS 1000ML (IV SOLUTION) ×1
IV NS 1000ML BAXH (IV SOLUTION) ×1 IMPLANT
MANIFOLD NEPTUNE II (INSTRUMENTS) ×2 IMPLANT
PACK CYSTO (CUSTOM PROCEDURE TRAY) ×2 IMPLANT
SHEATH URETERAL 12FRX35CM (MISCELLANEOUS) ×1 IMPLANT
STENT CONTOUR 6FRX24X.038 (STENTS) ×1 IMPLANT
TUBING CONNECTING 10 (TUBING) ×2 IMPLANT
TUBING UROLOGY SET (TUBING) ×2 IMPLANT

## 2018-04-23 NOTE — Interval H&P Note (Signed)
History and Physical Interval Note:  04/23/2018 7:35 AM  Michelle Gonzalez  has presented today for surgery, with the diagnosis of LEFT URETERAL CALCULUS  The various methods of treatment have been discussed with the patient and family. After consideration of risks, benefits and other options for treatment, the patient has consented to  Procedure(s) with comments: CYSTOSCOPY/URETEROSCOPY/HOLMIUM LASER/STENT PLACEMENT (Left) - 75 MINS as a surgical intervention .  The patient's history has been reviewed, patient examined, no change in status, stable for surgery.  I have reviewed the patient's chart and labs.  Questions were answered to the patient's satisfaction.     Lillette Boxer Amahd Morino

## 2018-04-23 NOTE — Anesthesia Postprocedure Evaluation (Signed)
Anesthesia Post Note  Patient: KANDI BRUSSEAU  Procedure(s) Performed: CYSTOSCOPY/URETEROSCOPY/HOLMIUM LASER/STENT EXTRATION AND PLACEMENT OF LEFT STENT (Left Ureter)     Patient location during evaluation: PACU Anesthesia Type: General Level of consciousness: awake and alert Pain management: pain level controlled Vital Signs Assessment: post-procedure vital signs reviewed and stable Respiratory status: spontaneous breathing, nonlabored ventilation, respiratory function stable and patient connected to nasal cannula oxygen Cardiovascular status: blood pressure returned to baseline and stable Postop Assessment: no apparent nausea or vomiting Anesthetic complications: no    Last Vitals:  Vitals:   04/23/18 0900 04/23/18 0915  BP: (!) 144/109 (!) 145/89  Pulse: 85 81  Resp: 13 14  Temp:  36.7 C  SpO2: 100% 100%    Last Pain:  Vitals:   04/23/18 0915  TempSrc:   PainSc: 2                  Dinesha Twiggs S

## 2018-04-23 NOTE — Op Note (Signed)
Preoperative diagnosis: Left upper ureteral and lower pole calculi  Postoperative diagnosis: Same  Principal procedure: Cystoscopy, left double-J stent extraction, left ureteroscopy (semirigid and flexible), holmium laser lithotripsy and extraction of calculi, placement of 6 French by 24 cm contour double-J stent with tether  Surgeon: Daine Gunther  Anesthesia: General with LMA  Complications: None  Estimated blood loss: Less than 10 mL  Specimen: Stone fragments  Drains: None  Indications: 43 year old female who presents at this time for definitive management of left upper ureteral and lower pole calculi.  She presented in mid December 2019 with possible UTI but significant pain from a left upper ureteral stone.  She was urgently stented.  She presents at this time following antibiotic management for definitive management with ureteroscopy, holmium laser lithotripsy and extraction of these calculi.  The procedure as well as risks and complications have been discussed with the patient.  She understands these and desires to proceed.  Findings: Urothelium of the bladder was normal.  Ureteral orifices were normal in configuration and location.  Stent was identified at the left ureteral orifice.  Following stent extraction, stone was present at the UPJ on the left.  Several small as well as one large calculus were present in the lower pole calyx.  Description of procedure: The patient was properly identified, marked in the holding area.  She received preoperative IV antibiotics.  She was taken to the operating room where general anesthetic was administered with the LMA.  She was placed in the dorsolithotomy position.  Genitalia and perineum were prepped and draped.  Proper timeout was performed.  21 French panendoscope advanced into the bladder.  Brief inspection was performed with normal findings.  The left ureteral orifice was identified and the stent was grasped and brought out through the  urethra.  The stent was cannulated with a sensor tip guidewire which was advanced up into the upper pole calyceal system.  The stent was then extracted over top of the guidewire which was left in.  The ureter was dilated first with the obturator than the entire 12/14 ureteral access catheter.  This was done quite easily.  I then remove the access catheter.  The semirigid scope was advanced up the ureter where the stone was identified at the UPJ.  It was grasped with the engage basket and brought out atraumatically and one piece.  There being no further ureteral calculi, I have replaced the access catheter.  The obturator and guidewire were removed.  The flexible scope was passed into the renal pelvis.  Inspection of the calyces was performed.  There was one small stone in the interpolar calyx that was grasped and removed.  Stones were present in the lower pole calyx.  The larger stone was grasped and brought into an upper pole calyx where it was fragmented with the holmium laser through a 200 m fiber using 15 Hz and 0.5 W of energy.  The stone was fragmented into multiple pole smaller fragments which were then easily grasped and removed with the engage basket.  Smaller stones in the lower pole calyx were also removed.  Inspection of the calyceal system revealed no further significant stones.  The ureteroscope was then removed.  Guidewire was replaced, access catheter removed, and following back loading of the guidewire through the cystoscope, 24 cm x 6 French contour double-J stent was deployed in the ureter with excellent proximal and distal curl seen with fluoroscopy and cystoscopy, respectively.  The bladder was drained.  Scope was removed.  The  tether was then knotted just outside the urethra, trimmed, and tucked within the vagina.  The procedure was then terminated.  The patient was awakened and taken to the PACU in stable condition.  She tolerated the procedure well.  She will follow-up in the office in a  couple of weeks.  She will need stone analysis as well as 66-TPEL urine/metabolic study.

## 2018-04-23 NOTE — Transfer of Care (Signed)
Immediate Anesthesia Transfer of Care Note  Patient: Michelle Gonzalez  Procedure(s) Performed: CYSTOSCOPY/URETEROSCOPY/HOLMIUM LASER/STENT EXTRATION AND PLACEMENT OF LEFT STENT (Left Ureter)  Patient Location: PACU  Anesthesia Type:General  Level of Consciousness: drowsy, patient cooperative and responds to stimulation  Airway & Oxygen Therapy: Patient Spontanous Breathing and Patient connected to face mask oxygen  Post-op Assessment: Report given to RN and Post -op Vital signs reviewed and stable  Post vital signs: Reviewed and stable  Last Vitals:  Vitals Value Taken Time  BP 141/100 04/23/2018  8:48 AM  Temp    Pulse 91 04/23/2018  8:49 AM  Resp 12 04/23/2018  8:49 AM  SpO2 100 % 04/23/2018  8:49 AM  Vitals shown include unvalidated device data.  Last Pain:  Vitals:   04/23/18 0557  TempSrc:   PainSc: 5          Complications: No apparent anesthesia complications

## 2018-04-23 NOTE — Anesthesia Preprocedure Evaluation (Addendum)
Anesthesia Evaluation  Patient identified by MRN, date of birth, ID band Patient awake    Reviewed: Allergy & Precautions, NPO status , Patient's Chart, lab work & pertinent test results  Airway Mallampati: II  TM Distance: >3 FB Neck ROM: Full    Dental no notable dental hx.    Pulmonary neg pulmonary ROS,    Pulmonary exam normal breath sounds clear to auscultation       Cardiovascular negative cardio ROS Normal cardiovascular exam Rhythm:Regular Rate:Normal     Neuro/Psych negative neurological ROS  negative psych ROS   GI/Hepatic Neg liver ROS, GERD  ,  Endo/Other  negative endocrine ROS  Renal/GU negative Renal ROS  negative genitourinary   Musculoskeletal negative musculoskeletal ROS (+)   Abdominal   Peds negative pediatric ROS (+)  Hematology  (+) anemia ,   Anesthesia Other Findings   Reproductive/Obstetrics negative OB ROS                             Anesthesia Physical Anesthesia Plan  ASA: II  Anesthesia Plan: General   Post-op Pain Management:    Induction: Intravenous  PONV Risk Score and Plan: 3 and Ondansetron, Dexamethasone and Treatment may vary due to age or medical condition  Airway Management Planned: LMA  Additional Equipment:   Intra-op Plan:   Post-operative Plan: Extubation in OR  Informed Consent: I have reviewed the patients History and Physical, chart, labs and discussed the procedure including the risks, benefits and alternatives for the proposed anesthesia with the patient or authorized representative who has indicated his/her understanding and acceptance.   Dental advisory given  Plan Discussed with: CRNA and Surgeon  Anesthesia Plan Comments:         Anesthesia Quick Evaluation

## 2018-04-23 NOTE — Anesthesia Procedure Notes (Signed)
Procedure Name: LMA Insertion Date/Time: 04/23/2018 7:48 AM Performed by: Glory Buff, CRNA Pre-anesthesia Checklist: Patient identified, Emergency Drugs available, Suction available and Patient being monitored Patient Re-evaluated:Patient Re-evaluated prior to induction Oxygen Delivery Method: Circle system utilized Preoxygenation: Pre-oxygenation with 100% oxygen Induction Type: IV induction LMA: LMA inserted LMA Size: 4.0 Number of attempts: 1 Placement Confirmation: positive ETCO2 Tube secured with: Tape Dental Injury: Teeth and Oropharynx as per pre-operative assessment

## 2018-04-23 NOTE — H&P (Signed)
H&P  Chief Complaint: Left kidney stone  History of Present Illness: Michelle Gonzalez is a 43 y.o. year old female Presents for cystoscopy, left double-J stent extraction, left ureteroscopy with holmium laser lithotripsy and extraction of left upper ureteral and left renal calculi.  She presented a her 2 weeks ago with an obstructing left upper ureteral stone and question of urinary tract infection.  She underwent urgent stenting and antibiotic management.  She presents at this time for definitive management of the upper ureteral stone and an incidental left lower pole stone.  A her a  Past Medical History:  Diagnosis Date  . Abnormal Papanicolaou smear of cervix with positive human papilloma virus (HPV) test 07/04/2017   LSIL with +HPV, colpo appt made   . Anxiety   . Dyspareunia 01/05/2014  . GERD (gastroesophageal reflux disease)   . Herniated disc   . Menstrual extraction 06/28/2013  . Migraines   . Other and unspecified ovarian cyst 03/30/2013  . Unspecified symptom associated with female genital organs 03/25/2013  . UTI (lower urinary tract infection)     Past Surgical History:  Procedure Laterality Date  . CESAREAN SECTION    . CYSTOSCOPY WITH STENT PLACEMENT Left 04/11/2018   Procedure: CYSTOSCOPY WITH RETROGRADE AND LEFT STENT PLACEMENT;  Surgeon: Franchot Gallo, MD;  Location: [facility];  Service: Urology;  Laterality: Left;  . LAPAROSCOPY    . WISDOM TOOTH EXTRACTION      Home Medications:  Medications Prior to Admission  Medication Sig Dispense Refill  . FLUoxetine (PROZAC) 20 MG capsule Take 20 mg by mouth daily.    Marland Kitchen ketorolac (TORADOL) 10 MG tablet Take 10 mg by mouth every 6 (six) hours as needed.    . norethindrone-ethinyl estradiol (JUNEL FE 1/20) 1-20 MG-MCG tablet Take 1 tablet by mouth daily. 3 Package 4  . oxybutynin (DITROPAN) 5 MG tablet Take 1 tablet (5 mg total) by mouth every 8 (eight) hours as needed for bladder spasms. 30 tablet 1  .  oxyCODONE-acetaminophen (PERCOCET/ROXICET) 5-325 MG tablet Take 1 tablet by mouth every 6 (six) hours as needed for moderate pain or severe pain.    Marland Kitchen topiramate (TOPAMAX) 25 MG capsule Take 25 mg by mouth 2 (two) times daily.    . traZODone (DESYREL) 50 MG tablet Take 50 mg by mouth at bedtime.    . SUMAtriptan (IMITREX) 50 MG tablet Take 50 mg by mouth every 2 (two) hours as needed for migraine. May repeat in 2 hours if headache persists or recurs.      Allergies:  Allergies  Allergen Reactions  . Sulfa Antibiotics Swelling    Mouth swells and she gets blisters.   . Sulfa Antibiotics     Mouth swelling    Family History  Problem Relation Age of Onset  . Diabetes Father   . Cancer Maternal Grandfather   . Cancer Paternal Grandmother   . Cancer Paternal Grandfather     Social History:  reports that she has never smoked. She has never used smokeless tobacco. She reports previous alcohol use. She reports that she does not use drugs.  ROS: A complete review of systems was performed.  All systems are negative except for pertinent findings as noted.  Physical Exam:  Vital signs in last 24 hours: Temp:  [98.5 F (36.9 C)] 98.5 F (36.9 C) (01/09 0544) Pulse Rate:  [61] 61 (01/09 0544) Resp:  [16] 16 (01/09 0544) BP: (137)/(89) 137/89 (01/09 0544) SpO2:  [100 %] 100 % (  01/09 0544) Weight:  [69.4 kg] 69.4 kg (01/09 0557) General:  Alert and oriented, No acute distress HEENT: Normocephalic, atraumatic Neck: No JVD or lymphadenopathy Cardiovascular: Regular rate and rhythm Lungs: Clear bilaterally Abdomen: Soft, nontender, nondistended, no abdominal masses Back: No CVA tenderness Extremities: No edema Neurologic: Grossly intact  Laboratory Data:  Results for orders placed or performed during the hospital encounter of 04/23/18 (from the past 24 hour(s))  Pregnancy, urine     Status: None   Collection Time: 04/23/18  5:56 AM  Result Value Ref Range   Preg Test, Ur NEGATIVE  NEGATIVE   No results found for this or any previous visit (from the past 240 hour(s)). Creatinine: No results for input(s): CREATININE in the last 168 hours.  Radiologic Imaging: No results found.  Impression/Assessment:   Left upper ureteral stone, left renal stone, status post stenting  Plan:  Cystoscopy, left double-J stent extraction, left ureteroscopy with holmium laser application and extraction of calculi  Lillette Boxer Danyla Wattley 04/23/2018, 6:15 AM  Lillette Boxer. Lashay Osborne MD

## 2018-04-23 NOTE — Discharge Instructions (Signed)
1. You may see some blood in the urine and may have some burning with urination for 48-72 hours. You also may notice that you have to urinate more frequently or urgently after your procedure which is normal.  2. You should call should you develop an inability urinate, fever > 101, persistent nausea and vomiting that prevents you from eating or drinking to stay hydrated.  3. If you have a stent, you will likely urinate more frequently and urgently until the stent is removed and you may experience some discomfort/pain in the lower abdomen and flank especially when urinating. You may take pain medication prescribed to you if needed for pain. You may also intermittently have blood in the urine until the stent is removed. 4.  OK to pull thread to remove stent on Monday    General Anesthesia, Adult, Care After This sheet gives you information about how to care for yourself after your procedure. Your health care provider may also give you more specific instructions. If you have problems or questions, contact your health care provider. What can I expect after the procedure? After the procedure, the following side effects are common:  Pain or discomfort at the IV site.  Nausea.  Vomiting.  Sore throat.  Trouble concentrating.  Feeling cold or chills.  Weak or tired.  Sleepiness and fatigue.  Soreness and body aches. These side effects can affect parts of the body that were not involved in surgery. Follow these instructions at home:  For at least 24 hours after the procedure:  Have a responsible adult stay with you. It is important to have someone help care for you until you are awake and alert.  Rest as needed.  Do not: ? Participate in activities in which you could fall or become injured. ? Drive. ? Use heavy machinery. ? Drink alcohol. ? Take sleeping pills or medicines that cause drowsiness. ? Make important decisions or sign legal documents. ? Take care of children on your  own. Eating and drinking  Follow any instructions from your health care provider about eating or drinking restrictions.  When you feel hungry, start by eating small amounts of foods that are soft and easy to digest (bland), such as toast. Gradually return to your regular diet.  Drink enough fluid to keep your urine pale yellow.  If you vomit, rehydrate by drinking water, juice, or clear broth. General instructions  If you have sleep apnea, surgery and certain medicines can increase your risk for breathing problems. Follow instructions from your health care provider about wearing your sleep device: ? Anytime you are sleeping, including during daytime naps. ? While taking prescription pain medicines, sleeping medicines, or medicines that make you drowsy.  Return to your normal activities as told by your health care provider. Ask your health care provider what activities are safe for you.  Take over-the-counter and prescription medicines only as told by your health care provider.  If you smoke, do not smoke without supervision.  Keep all follow-up visits as told by your health care provider. This is important. Contact a health care provider if:  You have nausea or vomiting that does not get better with medicine.  You cannot eat or drink without vomiting.  You have pain that does not get better with medicine.  You are unable to pass urine.  You develop a skin rash.  You have a fever.  You have redness around your IV site that gets worse. Get help right away if:  You have  difficulty breathing.  You have chest pain.  You have blood in your urine or stool, or you vomit blood. Summary  After the procedure, it is common to have a sore throat or nausea. It is also common to feel tired.  Have a responsible adult stay with you for the first 24 hours after general anesthesia. It is important to have someone help care for you until you are awake and alert.  When you feel hungry,  start by eating small amounts of foods that are soft and easy to digest (bland), such as toast. Gradually return to your regular diet.  Drink enough fluid to keep your urine pale yellow.  Return to your normal activities as told by your health care provider. Ask your health care provider what activities are safe for you. This information is not intended to replace advice given to you by your health care provider. Make sure you discuss any questions you have with your health care provider. Document Released: 07/08/2000 Document Revised: 11/15/2016 Document Reviewed: 11/15/2016 Elsevier Interactive Patient Education  2019 [employer].     Ureteral Stent Implantation, Care After Refer to this sheet in the next few weeks. These instructions provide you with information about caring for yourself after your procedure. Your health care provider may also give you more specific instructions. Your treatment has been planned according to current medical practices, but problems sometimes occur. Call your health care provider if you have any problems or questions after your procedure. What can I expect after the procedure? After the procedure, it is common to have: Nausea. Mild pain when you urinate. You may feel this pain in your lower back or lower abdomen. Pain should stop within a few minutes after you urinate. This may last for up to 1 week. A small amount of blood in your urine for several days. Follow these instructions at home:  Medicines Take over-the-counter and prescription medicines only as told by your health care provider. If you were prescribed an antibiotic medicine, take it as told by your health care provider. Do not stop taking the antibiotic even if you start to feel better. Do not drive for 24 hours if you received a sedative. Do not drive or operate heavy machinery while taking prescription pain medicines. Activity Return to your normal activities as told by your health care  provider. Ask your health care provider what activities are safe for you. Do not lift anything that is heavier than 10 lb (4.5 kg). Follow this limit for 1 week after your procedure, or for as long as told by your health care provider. General instructions Watch for any blood in your urine. Call your health care provider if the amount of blood in your urine increases. If you have a catheter: Follow instructions from your health care provider about taking care of your catheter and collection bag. Do not take baths, swim, or use a hot tub until your health care provider approves. Drink enough fluid to keep your urine clear or pale yellow. Keep all follow-up visits as told by your health care provider. This is important. Contact a health care provider if: You have pain that gets worse or does not get better with medicine, especially pain when you urinate. You have difficulty urinating. You feel nauseous or you vomit repeatedly during a period of more than 2 days after the procedure. Get help right away if: Your urine is dark red or has blood clots in it. You are leaking urine (have incontinence). The  end of the stent comes out of your urethra. You cannot urinate. You have sudden, sharp, or severe pain in your abdomen or lower back. You have a fever. This information is not intended to replace advice given to you by your health care provider. Make sure you discuss any questions you have with your health care provider. Document Released: 12/02/2012 Document Revised: 09/07/2015 Document Reviewed: 10/14/2014 Elsevier Interactive Patient Education  2019 [employer].  Cystoscopy  Cystoscopy is a procedure that is used to help diagnose and sometimes treat conditions that affect that lower urinary tract. The lower urinary tract includes the bladder and the tube that drains urine from the bladder out of the body (urethra). Cystoscopy is performed with a thin, tube-shaped instrument with a light and  camera at the end (cystoscope). The cystoscope may be hard (rigid) or flexible, depending on the goal of the procedure.The cystoscope is inserted through the urethra, into the bladder. Cystoscopy may be recommended if you have:  Urinary tractinfections that keep coming back (recurring).  Blood in the urine (hematuria).  Loss of bladder control (urinary incontinence) or an overactive bladder.  Unusual cells found in a urine sample.  A blockage in the urethra.  Painful urination.  An abnormality in the bladder found during an intravenous pyelogram (IVP) or CT scan. Cystoscopy may also be done to remove a sample of tissue to be examined under a microscope (biopsy). Tell a health care provider about:  Any allergies you have.  All medicines you are taking, including vitamins, herbs, eye drops, creams, and over-the-counter medicines.  Any problems you or family members have had with anesthetic medicines.  Any blood disorders you have.  Any surgeries you have had.  Any medical conditions you have.  Whether you are pregnant or may be pregnant. What are the risks? Generally, this is a safe procedure. However, problems may occur, including:  Infection.  Bleeding.  Allergic reactions to medicines.  Damage to other structures or organs. What happens before the procedure?  Ask your health care provider about: ? Changing or stopping your regular medicines. This is especially important if you are taking diabetes medicines or blood thinners. ? Taking medicines such as aspirin and ibuprofen. These medicines can thin your blood. Do not take these medicines before your procedure if your health care provider instructs you not to.  Follow instructions from your health care provider about eating or drinking restrictions.  You may be given antibiotic medicine to help prevent infection.  You may have an exam or testing, such as X-rays of the bladder, urethra, or kidneys.  You may have  urine tests to check for signs of infection.  Plan to have someone take you home after the procedure. What happens during the procedure?  To reduce your risk of infection,your health care team will wash or sanitize their hands.  You will be given one or more of the following: ? A medicine to help you relax (sedative). ? A medicine to numb the area (local anesthetic).  The area around the opening of your urethra will be cleaned.  The cystoscope will be passed through your urethra into your bladder.  Germ-free (sterile)fluid will flow through the cystoscope to fill your bladder. The fluid will stretch your bladder so that your surgeon can clearly examine your bladder walls.  The cystoscope will be removed and your bladder will be emptied. The procedure may vary among health care providers and hospitals. What happens after the procedure?  You may have some  soreness or pain in your abdomen and urethra. Medicines will be available to help you.  You may have some blood in your urine.  Do not drive for 24 hours if you received a sedative. This information is not intended to replace advice given to you by your health care provider. Make sure you discuss any questions you have with your health care provider. Document Released: 03/29/2000 Document Revised: 01/10/2017 Document Reviewed: 02/16/2015 Elsevier Interactive Patient Education  2019 [employer].

## 2018-04-24 ENCOUNTER — Encounter (HOSPITAL_COMMUNITY): Payer: Self-pay | Admitting: Urology

## 2018-05-05 DIAGNOSIS — N201 Calculus of ureter: Secondary | ICD-10-CM | POA: Diagnosis not present

## 2018-05-13 ENCOUNTER — Other Ambulatory Visit: Payer: Self-pay | Admitting: Adult Health

## 2018-05-21 DIAGNOSIS — N200 Calculus of kidney: Secondary | ICD-10-CM | POA: Diagnosis not present

## 2018-05-21 DIAGNOSIS — G43009 Migraine without aura, not intractable, without status migrainosus: Secondary | ICD-10-CM | POA: Diagnosis not present

## 2018-05-21 DIAGNOSIS — R3915 Urgency of urination: Secondary | ICD-10-CM | POA: Diagnosis not present

## 2018-05-21 DIAGNOSIS — Z6823 Body mass index (BMI) 23.0-23.9, adult: Secondary | ICD-10-CM | POA: Diagnosis not present

## 2018-05-22 DIAGNOSIS — G43009 Migraine without aura, not intractable, without status migrainosus: Secondary | ICD-10-CM | POA: Diagnosis not present

## 2018-05-22 DIAGNOSIS — N200 Calculus of kidney: Secondary | ICD-10-CM | POA: Diagnosis not present

## 2018-06-09 DIAGNOSIS — N202 Calculus of kidney with calculus of ureter: Secondary | ICD-10-CM | POA: Diagnosis not present

## 2018-06-09 DIAGNOSIS — R8271 Bacteriuria: Secondary | ICD-10-CM | POA: Diagnosis not present

## 2018-06-09 DIAGNOSIS — N201 Calculus of ureter: Secondary | ICD-10-CM | POA: Diagnosis not present

## 2018-06-15 ENCOUNTER — Other Ambulatory Visit: Payer: Self-pay | Admitting: Family Medicine

## 2018-06-15 DIAGNOSIS — G43009 Migraine without aura, not intractable, without status migrainosus: Secondary | ICD-10-CM

## 2018-06-23 ENCOUNTER — Other Ambulatory Visit: Payer: Self-pay | Admitting: Family Medicine

## 2018-06-24 ENCOUNTER — Ambulatory Visit
Admission: RE | Admit: 2018-06-24 | Discharge: 2018-06-24 | Disposition: A | Discharge status: Home / Self Care | Payer: BLUE CROSS/BLUE SHIELD | Source: Ambulatory Visit | Attending: Family Medicine | Admitting: Family Medicine

## 2018-06-24 ENCOUNTER — Other Ambulatory Visit: Payer: Self-pay

## 2018-06-24 DIAGNOSIS — H538 Other visual disturbances: Secondary | ICD-10-CM | POA: Diagnosis not present

## 2018-06-24 DIAGNOSIS — G43009 Migraine without aura, not intractable, without status migrainosus: Secondary | ICD-10-CM

## 2018-06-24 DIAGNOSIS — G43909 Migraine, unspecified, not intractable, without status migrainosus: Secondary | ICD-10-CM | POA: Diagnosis not present

## 2018-07-01 DIAGNOSIS — Z79891 Long term (current) use of opiate analgesic: Secondary | ICD-10-CM | POA: Diagnosis not present

## 2018-07-01 DIAGNOSIS — G894 Chronic pain syndrome: Secondary | ICD-10-CM | POA: Diagnosis not present

## 2018-07-01 DIAGNOSIS — Z5181 Encounter for therapeutic drug level monitoring: Secondary | ICD-10-CM | POA: Diagnosis not present

## 2018-07-01 DIAGNOSIS — M47816 Spondylosis without myelopathy or radiculopathy, lumbar region: Secondary | ICD-10-CM | POA: Diagnosis not present

## 2018-08-19 DIAGNOSIS — Z6824 Body mass index (BMI) 24.0-24.9, adult: Secondary | ICD-10-CM | POA: Diagnosis not present

## 2018-08-19 DIAGNOSIS — M545 Low back pain: Secondary | ICD-10-CM | POA: Diagnosis not present

## 2018-08-19 DIAGNOSIS — F411 Generalized anxiety disorder: Secondary | ICD-10-CM | POA: Diagnosis not present

## 2018-08-19 DIAGNOSIS — G43009 Migraine without aura, not intractable, without status migrainosus: Secondary | ICD-10-CM | POA: Diagnosis not present

## 2018-08-19 DIAGNOSIS — F5101 Primary insomnia: Secondary | ICD-10-CM | POA: Diagnosis not present

## 2018-09-08 ENCOUNTER — Other Ambulatory Visit: Payer: Self-pay | Admitting: Adult Health

## 2018-10-07 DIAGNOSIS — M5442 Lumbago with sciatica, left side: Secondary | ICD-10-CM | POA: Diagnosis not present

## 2018-10-08 DIAGNOSIS — Z0189 Encounter for other specified special examinations: Secondary | ICD-10-CM | POA: Diagnosis not present

## 2018-10-08 DIAGNOSIS — Z03818 Encounter for observation for suspected exposure to other biological agents ruled out: Secondary | ICD-10-CM | POA: Diagnosis not present

## 2018-10-22 DIAGNOSIS — M5431 Sciatica, right side: Secondary | ICD-10-CM | POA: Diagnosis not present

## 2018-10-22 DIAGNOSIS — M5432 Sciatica, left side: Secondary | ICD-10-CM | POA: Diagnosis not present

## 2018-11-18 DIAGNOSIS — M5441 Lumbago with sciatica, right side: Secondary | ICD-10-CM | POA: Diagnosis not present

## 2018-11-18 DIAGNOSIS — M5137 Other intervertebral disc degeneration, lumbosacral region: Secondary | ICD-10-CM | POA: Diagnosis not present

## 2018-11-18 DIAGNOSIS — R030 Elevated blood-pressure reading, without diagnosis of hypertension: Secondary | ICD-10-CM | POA: Diagnosis not present

## 2018-11-20 DIAGNOSIS — F5101 Primary insomnia: Secondary | ICD-10-CM | POA: Diagnosis not present

## 2018-11-20 DIAGNOSIS — M545 Low back pain: Secondary | ICD-10-CM | POA: Diagnosis not present

## 2018-11-20 DIAGNOSIS — F411 Generalized anxiety disorder: Secondary | ICD-10-CM | POA: Diagnosis not present

## 2018-11-20 DIAGNOSIS — G43009 Migraine without aura, not intractable, without status migrainosus: Secondary | ICD-10-CM | POA: Diagnosis not present

## 2018-12-14 DIAGNOSIS — G43009 Migraine without aura, not intractable, without status migrainosus: Secondary | ICD-10-CM | POA: Diagnosis not present

## 2018-12-14 DIAGNOSIS — M545 Low back pain: Secondary | ICD-10-CM | POA: Diagnosis not present

## 2018-12-14 DIAGNOSIS — R300 Dysuria: Secondary | ICD-10-CM | POA: Diagnosis not present

## 2018-12-14 DIAGNOSIS — F5101 Primary insomnia: Secondary | ICD-10-CM | POA: Diagnosis not present

## 2018-12-14 DIAGNOSIS — F411 Generalized anxiety disorder: Secondary | ICD-10-CM | POA: Diagnosis not present

## 2018-12-31 DIAGNOSIS — M5137 Other intervertebral disc degeneration, lumbosacral region: Secondary | ICD-10-CM | POA: Diagnosis not present

## 2019-01-26 ENCOUNTER — Other Ambulatory Visit: Payer: Self-pay | Admitting: Adult Health

## 2019-01-27 DIAGNOSIS — M5137 Other intervertebral disc degeneration, lumbosacral region: Secondary | ICD-10-CM | POA: Diagnosis not present

## 2019-01-27 DIAGNOSIS — R030 Elevated blood-pressure reading, without diagnosis of hypertension: Secondary | ICD-10-CM | POA: Diagnosis not present

## 2019-01-27 DIAGNOSIS — M47816 Spondylosis without myelopathy or radiculopathy, lumbar region: Secondary | ICD-10-CM | POA: Diagnosis not present

## 2019-01-27 DIAGNOSIS — M5136 Other intervertebral disc degeneration, lumbar region: Secondary | ICD-10-CM | POA: Diagnosis not present

## 2019-04-01 DIAGNOSIS — M5137 Other intervertebral disc degeneration, lumbosacral region: Secondary | ICD-10-CM | POA: Diagnosis not present

## 2019-04-01 DIAGNOSIS — M5412 Radiculopathy, cervical region: Secondary | ICD-10-CM | POA: Diagnosis not present

## 2019-04-21 DIAGNOSIS — M545 Low back pain: Secondary | ICD-10-CM | POA: Diagnosis not present

## 2019-04-21 DIAGNOSIS — F5101 Primary insomnia: Secondary | ICD-10-CM | POA: Diagnosis not present

## 2019-04-21 DIAGNOSIS — G43009 Migraine without aura, not intractable, without status migrainosus: Secondary | ICD-10-CM | POA: Diagnosis not present

## 2019-04-21 DIAGNOSIS — F411 Generalized anxiety disorder: Secondary | ICD-10-CM | POA: Diagnosis not present

## 2019-04-22 ENCOUNTER — Telehealth: Payer: Self-pay | Admitting: *Deleted

## 2019-04-22 ENCOUNTER — Other Ambulatory Visit: Payer: Self-pay | Admitting: Adult Health

## 2019-04-22 NOTE — Telephone Encounter (Signed)
Pt informed that she needs pap before any more refills.

## 2019-04-22 NOTE — Telephone Encounter (Signed)
Pt needs refill on her Junel.

## 2019-04-29 DIAGNOSIS — M5137 Other intervertebral disc degeneration, lumbosacral region: Secondary | ICD-10-CM | POA: Diagnosis not present

## 2019-04-29 DIAGNOSIS — M542 Cervicalgia: Secondary | ICD-10-CM | POA: Diagnosis not present

## 2019-04-29 DIAGNOSIS — M5441 Lumbago with sciatica, right side: Secondary | ICD-10-CM | POA: Diagnosis not present

## 2019-05-06 ENCOUNTER — Other Ambulatory Visit: Payer: Self-pay | Admitting: Neurosurgery

## 2019-05-06 DIAGNOSIS — M542 Cervicalgia: Secondary | ICD-10-CM

## 2019-05-06 DIAGNOSIS — M5441 Lumbago with sciatica, right side: Secondary | ICD-10-CM

## 2019-05-12 ENCOUNTER — Other Ambulatory Visit: Payer: Self-pay | Admitting: Adult Health

## 2019-05-31 ENCOUNTER — Other Ambulatory Visit: Payer: Self-pay | Admitting: Adult Health

## 2019-05-31 ENCOUNTER — Ambulatory Visit
Admission: RE | Admit: 2019-05-31 | Discharge: 2019-05-31 | Disposition: A | Discharge status: Home / Self Care | Payer: BC Managed Care – PPO | Source: Ambulatory Visit | Attending: Neurosurgery | Admitting: Neurosurgery

## 2019-05-31 ENCOUNTER — Ambulatory Visit
Admission: RE | Admit: 2019-05-31 | Discharge: 2019-05-31 | Disposition: A | Discharge status: Home / Self Care | Payer: BLUE CROSS/BLUE SHIELD | Source: Ambulatory Visit | Attending: Neurosurgery | Admitting: Neurosurgery

## 2019-05-31 DIAGNOSIS — M5136 Other intervertebral disc degeneration, lumbar region: Secondary | ICD-10-CM | POA: Diagnosis not present

## 2019-05-31 DIAGNOSIS — M4802 Spinal stenosis, cervical region: Secondary | ICD-10-CM | POA: Diagnosis not present

## 2019-05-31 DIAGNOSIS — M542 Cervicalgia: Secondary | ICD-10-CM

## 2019-05-31 DIAGNOSIS — M5441 Lumbago with sciatica, right side: Secondary | ICD-10-CM

## 2019-06-02 ENCOUNTER — Telehealth: Payer: Self-pay | Admitting: *Deleted

## 2019-06-02 NOTE — Telephone Encounter (Signed)
Pharmacy called requesting that rx be sent in to take one tablet daily for 21 days and to skip placebo pills. Patient aware that she needs an appointment and will call after her lunch break to set that up. Says that her dad had surgery and she just hasn't been able to make time to come in.

## 2019-06-04 MED ORDER — NORETHIN ACE-ETH ESTRAD-FE 1-20 MG-MCG PO TABS: ORAL_TABLET | ORAL | 1 refills | Status: DC

## 2019-06-04 NOTE — Addendum Note (Signed)
Addended by: Derrek Monaco A on: 06/04/2019 08:10 AM   Modules accepted: Orders

## 2019-06-04 NOTE — Telephone Encounter (Signed)
Refilled oCs

## 2019-06-08 DIAGNOSIS — M5416 Radiculopathy, lumbar region: Secondary | ICD-10-CM | POA: Diagnosis not present

## 2019-06-08 DIAGNOSIS — R030 Elevated blood-pressure reading, without diagnosis of hypertension: Secondary | ICD-10-CM | POA: Diagnosis not present

## 2019-06-08 DIAGNOSIS — M5137 Other intervertebral disc degeneration, lumbosacral region: Secondary | ICD-10-CM | POA: Diagnosis not present

## 2019-06-08 DIAGNOSIS — M5030 Other cervical disc degeneration, unspecified cervical region: Secondary | ICD-10-CM | POA: Diagnosis not present

## 2019-06-25 ENCOUNTER — Other Ambulatory Visit: Payer: Self-pay | Admitting: Adult Health

## 2019-07-01 DIAGNOSIS — M5416 Radiculopathy, lumbar region: Secondary | ICD-10-CM | POA: Diagnosis not present

## 2019-07-08 DIAGNOSIS — M5137 Other intervertebral disc degeneration, lumbosacral region: Secondary | ICD-10-CM | POA: Diagnosis not present

## 2019-07-08 DIAGNOSIS — M5412 Radiculopathy, cervical region: Secondary | ICD-10-CM | POA: Diagnosis not present

## 2019-07-08 DIAGNOSIS — Z6826 Body mass index (BMI) 26.0-26.9, adult: Secondary | ICD-10-CM | POA: Diagnosis not present

## 2019-07-08 DIAGNOSIS — R030 Elevated blood-pressure reading, without diagnosis of hypertension: Secondary | ICD-10-CM | POA: Diagnosis not present

## 2019-07-15 DIAGNOSIS — R609 Edema, unspecified: Secondary | ICD-10-CM | POA: Diagnosis not present

## 2019-07-15 DIAGNOSIS — R300 Dysuria: Secondary | ICD-10-CM | POA: Diagnosis not present

## 2019-07-15 DIAGNOSIS — Z6827 Body mass index (BMI) 27.0-27.9, adult: Secondary | ICD-10-CM | POA: Diagnosis not present

## 2019-07-23 ENCOUNTER — Other Ambulatory Visit: Payer: BC Managed Care – PPO | Admitting: Adult Health

## 2019-07-29 ENCOUNTER — Other Ambulatory Visit (HOSPITAL_COMMUNITY)
Admission: RE | Admit: 2019-07-29 | Discharge: 2019-07-29 | Disposition: A | Discharge status: Home / Self Care | Payer: BC Managed Care – PPO | Source: Ambulatory Visit | Attending: Adult Health | Admitting: Adult Health

## 2019-07-29 ENCOUNTER — Encounter: Payer: Self-pay | Admitting: Adult Health

## 2019-07-29 ENCOUNTER — Other Ambulatory Visit: Payer: Self-pay

## 2019-07-29 ENCOUNTER — Ambulatory Visit (INDEPENDENT_AMBULATORY_CARE_PROVIDER_SITE_OTHER): Payer: BC Managed Care – PPO | Admitting: Adult Health

## 2019-07-29 VITALS — BP 100/71 | HR 66 | Ht 67.0 in | Wt 168.0 lb

## 2019-07-29 DIAGNOSIS — Z01419 Encounter for gynecological examination (general) (routine) without abnormal findings: Secondary | ICD-10-CM | POA: Insufficient documentation

## 2019-07-29 DIAGNOSIS — Z793 Long term (current) use of hormonal contraceptives: Secondary | ICD-10-CM

## 2019-07-29 DIAGNOSIS — R350 Frequency of micturition: Secondary | ICD-10-CM | POA: Diagnosis not present

## 2019-07-29 DIAGNOSIS — Z1212 Encounter for screening for malignant neoplasm of rectum: Secondary | ICD-10-CM | POA: Diagnosis not present

## 2019-07-29 DIAGNOSIS — Z3041 Encounter for surveillance of contraceptive pills: Secondary | ICD-10-CM

## 2019-07-29 DIAGNOSIS — Z1211 Encounter for screening for malignant neoplasm of colon: Secondary | ICD-10-CM | POA: Insufficient documentation

## 2019-07-29 LAB — POCT URINALYSIS DIPSTICK
Glucose, UA: NEGATIVE
Ketones, UA: NEGATIVE
Nitrite, UA: NEGATIVE
Protein, UA: NEGATIVE

## 2019-07-29 LAB — HEMOCCULT GUIAC POC 1CARD (OFFICE): Fecal Occult Blood, POC: NEGATIVE

## 2019-07-29 MED ORDER — NORETHIN ACE-ETH ESTRAD-FE 1-20 MG-MCG PO TABS: ORAL_TABLET | ORAL | 4 refills | Status: DC

## 2019-07-29 MED ORDER — FLUCONAZOLE 150 MG PO TABS: ORAL_TABLET | ORAL | 6 refills | Status: DC

## 2019-07-29 NOTE — Progress Notes (Signed)
error 

## 2019-07-29 NOTE — Progress Notes (Addendum)
Patient ID: Michelle Gonzalez, female   DOB: 1975/10/27, 44 y.o.   MRN: [id number] History of Present Illness: Michelle Gonzalez is a 44 year old white female, divorced, G2P1011, in for a well woman gyn exam and pap.She had LSIL with +HPV 07/01/17 and colpo CIN1. She was treated for UTI about 2 weeks ago. She requests refill on diflucan.  PCP is Dr Quintin Alto    Current Medications, Allergies, Past Medical History, Past Surgical History, Family History and Social History were reviewed in [facility] record.     Review of Systems: Patient denies any headaches, hearing loss, fatigue, blurred vision, shortness of breath, chest pain, abdominal pain, problems with  Urination(some frequency), or intercourse. No joint pain or mood swings. +constipation, try Senna S 2 daily, it is OTC Happy with OCs   Physical Exam:BP 100/71 (BP Location: Left Arm, Patient Position: Sitting, Cuff Size: Normal)   Pulse 66   Ht 5\' 7"  (1.702 m)   Wt 168 lb (76.2 kg)   BMI 26.31 kg/m   Urine dipstick trace blood and leuks. General:  Well developed, well nourished, no acute distress Skin:  Warm and dry Neck:  Midline trachea, normal thyroid, good ROM, no lymphadenopathy Lungs; Clear to auscultation bilaterally Breast:  No dominant palpable mass, retraction, or nipple discharge Cardiovascular: Regular rate and rhythm Abdomen:  Soft, non tender, no hepatosplenomegaly Pelvic:  External genitalia is normal in appearance, no lesions.  The vagina is normal in appearance. Urethra has no lesions or masses. The cervix is bulbous.Pap with high risk HPV 16/18 genotyping performed.  Uterus is felt to be normal size, shape, and contour.  No adnexal masses or tenderness noted.Bladder is non tender, no masses felt. Rectal: Good sphincter tone, no polyps, or hemorrhoids felt.  Hemoccult negative. Extremities/musculoskeletal:  No swelling or varicosities noted, no clubbing or cyanosis Psych:  No mood changes, alert and  cooperative,seems happy AA1 PHQ 9 score is 0 Examination chaperoned by Rolena Infante LPN  Impression and Plan: 1. Urinary frequency  2. Encounter for gynecological examination with Papanicolaou smear of cervix Pap sent Physical in 1 year Pap in 3 if normal Get mammogram now and yearly Labs with PCP  3. Encounter for surveillance of contraceptive pills Refilled Junel 1/20 Meds ordered this encounter  Medications  . norethindrone-ethinyl estradiol (JUNEL FE 1/20) 1-20 MG-MCG tablet    Sig: TAKE ONE TABLET BY MOUTH DAILY FOR 21 DAYS - SKIP PLACEBOS AND BEGIN NEW PACK.    Dispense:  84 tablet    Refill:  4    This prescription was filled on 06/25/2019. Any refills authorized will be placed on file.    Order Specific Question:   Supervising Provider    Answer:   Tania Ade H [[id number]]  . fluconazole (DIFLUCAN) 150 MG tablet    Sig: TAKE ONE TABLET BY MOUTH,THEN REPEAT IN 3 DAYS IF NEEDED.    Dispense:  2 tablet    Refill:  6    Order Specific Question:   Supervising Provider    Answer:   Elonda Husky, LUTHER H [[id number]]    4. Screening for colorectal cancer

## 2019-07-30 LAB — CYTOLOGY - PAP
Comment: NEGATIVE
Diagnosis: NEGATIVE
High risk HPV: NEGATIVE

## 2019-08-05 ENCOUNTER — Other Ambulatory Visit: Payer: Self-pay

## 2019-08-05 ENCOUNTER — Ambulatory Visit: Payer: BC Managed Care – PPO | Admitting: Physician Assistant

## 2019-08-05 ENCOUNTER — Encounter: Payer: Self-pay | Admitting: Physician Assistant

## 2019-08-05 DIAGNOSIS — L820 Inflamed seborrheic keratosis: Secondary | ICD-10-CM

## 2019-08-05 DIAGNOSIS — D485 Neoplasm of uncertain behavior of skin: Secondary | ICD-10-CM

## 2019-08-05 DIAGNOSIS — L709 Acne, unspecified: Secondary | ICD-10-CM | POA: Diagnosis not present

## 2019-08-05 DIAGNOSIS — L811 Chloasma: Secondary | ICD-10-CM | POA: Diagnosis not present

## 2019-08-05 DIAGNOSIS — D225 Melanocytic nevi of trunk: Secondary | ICD-10-CM | POA: Diagnosis not present

## 2019-08-05 MED ORDER — HYDROQUINONE 4 % EX CREA: TOPICAL_CREAM | Freq: Every morning | CUTANEOUS | 0 refills | Status: DC

## 2019-08-05 MED ORDER — TRETINOIN 0.025 % EX CREA: TOPICAL_CREAM | Freq: Every evening | CUTANEOUS | 0 refills | Status: DC

## 2019-08-05 NOTE — Progress Notes (Signed)
   New Patient Visit  Subjective  Michelle Gonzalez is a 44 y.o. female who presents for the following: Annual Exam (isk left arm larger wants ln2). Lesions on left arm have been there 3 years, not sore. Lesion left lower leg darker in last year. Not sore.  Mole is raised right posterior shoulder has been there years. Seems to be growing. Dark spots on face had frozen in the past that have come back. Dark spots have been a problem for years. She tried a cream in the past but cant remember the name. She uses 50spf when she is going to be out in the sun.    Objective  Well appearing patient in no apparent distress; mood and affect are within normal limits.  A full examination was performed including face, eyes, ears, nose, lips, neck, chest, axillae, abdomen, back, buttocks, bilateral upper extremities, bilateral lower extremities, hands, feet, fingers, toes, fingernails, and toenails. All findings within normal limits unless otherwise noted below.  Objective  Head - Anterior (Face): Erythematous papules and pustules with comedones. Hyperpigmentation.  Objective  Left Forearm - Anterior (3), Left Lower Leg - Anterior (3), Right Forearm - Anterior, Right Lower Leg - Anterior (3): Erythematous stuck-on, waxy papule or plaque.   Objective  Left Forehead, Left Malar Cheek, Right Malar Cheek: Reticulated hyperpigmented patches.   Objective  Right Upper Back: 8.8 cm to flat tan spot Brown inflamed papule       Assessment & Plan  Acne, unspecified acne type Head - Anterior (Face)  tretinoin (RETIN-A) 0.025 % cream - Head - Anterior (Face)  hydroquinone 4 % cream - Head - Anterior (Face)  Inflamed seborrheic keratosis (10) Left Forearm - Anterior (3); Right Forearm - Anterior; Left Lower Leg - Anterior (3); Right Lower Leg - Anterior (3)  Destruction of lesion - Left Forearm - Anterior, Left Lower Leg - Anterior, Right Forearm - Anterior, Right Lower Leg - Anterior Complexity:  simple   Destruction method: cryotherapy   Informed consent: discussed and consent obtained   Timeout:  patient name, date of birth, surgical site, and procedure verified Lesion destroyed using liquid nitrogen: Yes   Outcome: patient tolerated procedure well with no complications    Melasma (3) Left Malar Cheek; Right Malar Cheek; Left Forehead  Tretinoin 0.025% and Hydroquinone.   Neoplasm of uncertain behavior of skin Right Upper Back  Skin / nail biopsy Type of biopsy: tangential   Procedure prep:  Patient was prepped and draped in usual sterile fashion (Non sterile) Prep type:  Chlorhexidine Anesthesia: the lesion was anesthetized in a standard fashion   Anesthetic:  1% lidocaine w/ epinephrine 1-100,000 local infiltration Instrument used: flexible razor blade    Specimen 1 - Surgical pathology Differential Diagnosis: R/O Atypia Check Margins: No

## 2019-08-05 NOTE — Patient Instructions (Signed)

## 2019-08-09 ENCOUNTER — Telehealth: Payer: Self-pay

## 2019-08-09 NOTE — Telephone Encounter (Signed)
-----   Message from Arlyss Gandy, [place] sent at 08/09/2019  8:19 AM EDT ----- Benign back

## 2019-08-09 NOTE — Telephone Encounter (Signed)
Phone call to patient with her Pathology results.  Patient aware of pathology results.

## 2019-08-11 ENCOUNTER — Telehealth: Payer: Self-pay

## 2019-08-11 DIAGNOSIS — L709 Acne, unspecified: Secondary | ICD-10-CM

## 2019-08-11 MED ORDER — TRETINOIN 0.025 % EX CREA: TOPICAL_CREAM | Freq: Every evening | CUTANEOUS | 0 refills | Status: AC

## 2019-08-11 NOTE — Telephone Encounter (Signed)
Michelle Gonzalez (Key: [id number])  This request has received a Favorable outcome from [facility].  Please keep in mind this is not a guarantee of payment. Eligibility and Benefit determinations will be made at the time of service.  Please note any additional information provided by [facility] at the bottom of the screen.

## 2019-08-11 NOTE — Telephone Encounter (Signed)
Drug Tretinoin 0.025% cream Form [employer] control surveyor Form (CB) Original Claim Info 75 DRUG REQUIRES PRIOR [id number]: For RxLocal Coupon Price of: $68.88 submit to BIN: [id number] PCN: CP Group: COUP[facility] --Service provided at no cost and no switch fee to the pharmacy-- Michelle Gonzalez Key: [id number] - Rx #: [id number]

## 2019-08-15 DIAGNOSIS — J029 Acute pharyngitis, unspecified: Secondary | ICD-10-CM | POA: Diagnosis not present

## 2019-08-15 DIAGNOSIS — Z6825 Body mass index (BMI) 25.0-25.9, adult: Secondary | ICD-10-CM | POA: Diagnosis not present

## 2019-08-24 DIAGNOSIS — Z20828 Contact with and (suspected) exposure to other viral communicable diseases: Secondary | ICD-10-CM | POA: Diagnosis not present

## 2019-09-08 DIAGNOSIS — M5412 Radiculopathy, cervical region: Secondary | ICD-10-CM | POA: Diagnosis not present

## 2019-09-08 DIAGNOSIS — M542 Cervicalgia: Secondary | ICD-10-CM | POA: Diagnosis not present

## 2019-09-08 DIAGNOSIS — M5030 Other cervical disc degeneration, unspecified cervical region: Secondary | ICD-10-CM | POA: Diagnosis not present

## 2019-09-08 DIAGNOSIS — M5137 Other intervertebral disc degeneration, lumbosacral region: Secondary | ICD-10-CM | POA: Diagnosis not present

## 2020-06-21 ENCOUNTER — Other Ambulatory Visit: Payer: Self-pay | Admitting: Adult Health

## 2020-08-31 ENCOUNTER — Other Ambulatory Visit: Payer: Self-pay | Admitting: Adult Health

## 2021-01-09 ENCOUNTER — Encounter: Payer: Self-pay | Admitting: *Deleted

## 2021-01-09 ENCOUNTER — Ambulatory Visit (INDEPENDENT_AMBULATORY_CARE_PROVIDER_SITE_OTHER): Payer: 59 | Admitting: Cardiology

## 2021-01-09 ENCOUNTER — Encounter: Payer: Self-pay | Admitting: Cardiology

## 2021-01-09 ENCOUNTER — Ambulatory Visit (INDEPENDENT_AMBULATORY_CARE_PROVIDER_SITE_OTHER): Payer: 59

## 2021-01-09 ENCOUNTER — Other Ambulatory Visit: Payer: Self-pay | Admitting: Cardiology

## 2021-01-09 VITALS — BP 140/94 | HR 74 | Ht 67.0 in | Wt 155.2 lb

## 2021-01-09 DIAGNOSIS — R002 Palpitations: Secondary | ICD-10-CM

## 2021-01-09 NOTE — Progress Notes (Signed)
Clinical Summary Ms. Faulstich is a 45 y.o.female seen as a new consult, referred by PA Kayleen Memos for palpitations.   1.Palpitations - over the last year, increasing in frequency - feeling of heart racing, feels lightheaded, dizzy. Mild SOB - episodes last about 15-20 seconds. After episode residual dizziness - episodes 1-2 times per week - no specific triggers  - rare coffee, rare sodas, ice tea [employer] large x 1, rare EtOH - off phentermine at least 3-4 months. Was having symptoms prior and after being on this medication. No longer on propanolol for migraines     Past Medical History:  Diagnosis Date   Abnormal Papanicolaou smear of cervix with positive human papilloma virus (HPV) test 07/04/2017   LSIL with +HPV, colpo appt made    Anxiety    Dyspareunia 01/05/2014   GERD (gastroesophageal reflux disease)    Herniated disc    Menstrual extraction 06/28/2013   Migraines    Other and unspecified ovarian cyst 03/30/2013   Unspecified symptom associated with female genital organs 03/25/2013   UTI (lower urinary tract infection)      Allergies  Allergen Reactions   Sulfa Antibiotics Swelling    Mouth swells and she gets blisters.    Sulfa Antibiotics     Mouth swelling     Current Outpatient Medications  Medication Sig Dispense Refill   ALPRAZolam (XANAX) 0.5 MG tablet TAKE ONE TABLET BY MOUTH THREE TIMES DAILY AS NEEDED FOR ANXIETY.     BLISOVI FE 1/20 1-20 MG-MCG tablet TAKE ONE TABLET BY MOUTH DAILY FOR 21 DAYS - SKIP PLACEBOS AND BEGIN NEW PACK. 84 tablet 4   fluconazole (DIFLUCAN) 150 MG tablet TAKE ONE TABLET BY MOUTH ONCE, THEN REPEAT IN 3 DAYS IF NEEDED. 2 tablet 6   FLUoxetine (PROZAC) 20 MG capsule Take 20 mg by mouth daily.     furosemide (LASIX) 20 MG tablet Take 20 mg by mouth daily as needed.     hydroquinone 4 % cream Apply topically in the morning. 28.35 g 0   oxyCODONE-acetaminophen (PERCOCET/ROXICET) 5-325 MG tablet Take 1 tablet by mouth every 6  (six) hours as needed for moderate pain or severe pain.     propranolol (INDERAL) 40 MG tablet Take 40 mg by mouth at bedtime.     SUMAtriptan (IMITREX) 50 MG tablet Take 50 mg by mouth every 2 (two) hours as needed for migraine. May repeat in 2 hours if headache persists or recurs.     traZODone (DESYREL) 50 MG tablet Take 50 mg by mouth at bedtime.     No current facility-administered medications for this visit.     Past Surgical History:  Procedure Laterality Date   CESAREAN SECTION     CYSTOSCOPY WITH STENT PLACEMENT Left 04/11/2018   Procedure: CYSTOSCOPY WITH RETROGRADE AND LEFT STENT PLACEMENT;  Surgeon: Franchot Gallo, MD;  Location: [facility];  Service: Urology;  Laterality: Left;   CYSTOSCOPY/URETEROSCOPY/HOLMIUM LASER/STENT PLACEMENT Left 04/23/2018   Procedure: CYSTOSCOPY/URETEROSCOPY/HOLMIUM LASER/STENT EXTRATION AND PLACEMENT OF LEFT STENT;  Surgeon: Franchot Gallo, MD;  Location: [facility];  Service: Urology;  Laterality: Left;  75 MINS   LAPAROSCOPY     WISDOM TOOTH EXTRACTION       Allergies  Allergen Reactions   Sulfa Antibiotics Swelling    Mouth swells and she gets blisters.    Sulfa Antibiotics     Mouth swelling      Family History  Problem Relation Age of Onset   Diabetes Father  Cancer Maternal Grandfather    Cancer Paternal Grandmother    Cancer Paternal Grandfather      Social History Ms. Gruwell reports that she has never smoked. She has never used smokeless tobacco. Ms. Gillott reports that she does not currently use alcohol.   Review of Systems CONSTITUTIONAL: No weight loss, fever, chills, weakness or fatigue.  HEENT: Eyes: No visual loss, blurred vision, double vision or yellow sclerae.No hearing loss, sneezing, congestion, runny nose or sore throat.  SKIN: No rash or itching.  CARDIOVASCULAR: per hpi RESPIRATORY: No shortness of breath, cough or sputum.  GASTROINTESTINAL: No anorexia, nausea, vomiting or diarrhea. No abdominal pain or  blood.  GENITOURINARY: No burning on urination, no polyuria NEUROLOGICAL: No headache, dizziness, syncope, paralysis, ataxia, numbness or tingling in the extremities. No change in bowel or bladder control.  MUSCULOSKELETAL: No muscle, back pain, joint pain or stiffness.  LYMPHATICS: No enlarged nodes. No history of splenectomy.  PSYCHIATRIC: No history of depression or anxiety.  ENDOCRINOLOGIC: No reports of sweating, cold or heat intolerance. No polyuria or polydipsia.  Marland Kitchen   Physical Examination Today's Vitals   01/09/21 0858  BP: (!) 140/94  Pulse: 74  SpO2: 98%  Weight: 155 lb 3.2 oz (70.4 kg)  Height: 5\' 7"  (1.702 m)   Body mass index is 24.31 kg/m.  Gen: resting comfortably, no acute distress HEENT: no scleral icterus, pupils equal round and reactive, no palptable cervical adenopathy,  CV: RRR, no m/r/g no jvd Resp: Clear to auscultation bilaterally GI: abdomen is soft, non-tender, non-distended, normal bowel sounds, no hepatosplenomegaly MSK: extremities are warm, no edema.  Skin: warm, no rash Neuro:  no focal deficits Psych: appropriate affect      Assessment and Plan   1.Palpitations - progressing symptoms. [place] EKG shows NSR - we will obtain a 14 day zio patch to further evaluate.      Arnoldo Lenis, M.D.

## 2021-01-09 NOTE — Patient Instructions (Addendum)
Medication Instructions:  Continue all current medications.     Labwork: none  Testing/Procedures: Your physician has recommended that you wear a 14 day event monitor. Event monitors are medical devices that record the heart's electrical activity. Doctors most often [place] these monitors to diagnose arrhythmias. Arrhythmias are problems with the speed or rhythm of the heartbeat. The monitor is a small, portable device. You can wear one while you do your normal daily activities. This is usually used to diagnose what is causing palpitations/syncope (passing out). Office will contact with results via phone or letter.     Follow-Up: 6 weeks    Any Other Special Instructions Will Be Listed Below (If Applicable).   If you need a refill on your cardiac medications before your next appointment, please call your pharmacy.

## 2021-01-22 DIAGNOSIS — R002 Palpitations: Secondary | ICD-10-CM | POA: Diagnosis not present

## 2021-01-29 ENCOUNTER — Telehealth: Payer: Self-pay | Admitting: Cardiology

## 2021-01-29 NOTE — Telephone Encounter (Signed)
New message    Fax information to [facility] health for prior authorization ECG   Fax [phone]

## 2021-02-19 NOTE — Progress Notes (Deleted)
Cardiology Office Note  Date: 02/19/2021   ID: Michelle Gonzalez, DOB 1975-09-24, MRN [id number]  PCP:  Manon Hilding, MD  Cardiologist:  None Electrophysiologist:  None   Chief Complaint: Follow up monitor Palpitations  History of Present Illness: Michelle Gonzalez is a 45 y.o. female with a history of palpitations.    She was last seen by Dr. Harl Bowie on 01/09/2021 for palpitations which have been occurring over the last year with increasing frequency.  She has associated feelings of lightheadedness, dizziness and mild shortness of breath with sensation of heart racing.  Episodes could occur 1-2 times per week with no specific triggers.  She had been off of phentermine for least 3 to 4 months.  She was no longer on propranolol for migraines.  She drinks rare coffee and rare sodas.  Drink at least 80.  Rare alcohol.  Plan was about 14-day Zio patch.  Do not see results  Past Medical History:  Diagnosis Date   Abnormal Papanicolaou smear of cervix with positive human papilloma virus (HPV) test 07/04/2017   LSIL with +HPV, colpo appt made    Anxiety    Dyspareunia 01/05/2014   GERD (gastroesophageal reflux disease)    Herniated disc    Menstrual extraction 06/28/2013   Migraines    Other and unspecified ovarian cyst 03/30/2013   Unspecified symptom associated with female genital organs 03/25/2013   UTI (lower urinary tract infection)     Past Surgical History:  Procedure Laterality Date   CESAREAN SECTION     CYSTOSCOPY WITH STENT PLACEMENT Left 04/11/2018   Procedure: CYSTOSCOPY WITH RETROGRADE AND LEFT STENT PLACEMENT;  Surgeon: Franchot Gallo, MD;  Location: [facility];  Service: Urology;  Laterality: Left;   CYSTOSCOPY/URETEROSCOPY/HOLMIUM LASER/STENT PLACEMENT Left 04/23/2018   Procedure: CYSTOSCOPY/URETEROSCOPY/HOLMIUM LASER/STENT EXTRATION AND PLACEMENT OF LEFT STENT;  Surgeon: Franchot Gallo, MD;  Location: [facility];  Service: Urology;  Laterality: Left;  75 MINS    LAPAROSCOPY     WISDOM TOOTH EXTRACTION      Current Outpatient Medications  Medication Sig Dispense Refill   ALPRAZolam (XANAX) 0.5 MG tablet TAKE ONE TABLET BY MOUTH THREE TIMES DAILY AS NEEDED FOR ANXIETY. (Patient not taking: Reported on 01/09/2021)     BLISOVI FE 1/20 1-20 MG-MCG tablet TAKE ONE TABLET BY MOUTH DAILY FOR 21 DAYS - SKIP PLACEBOS AND BEGIN NEW PACK. 84 tablet 4   diazepam (VALIUM) 10 MG tablet Take 10 mg by mouth 2 (two) times daily as needed.     EMGALITY 120 MG/ML SOAJ Inject 1 mL into the skin every 30 (thirty) days.     fluconazole (DIFLUCAN) 150 MG tablet TAKE ONE TABLET BY MOUTH ONCE, THEN REPEAT IN 3 DAYS IF NEEDED. 2 tablet 6   FLUoxetine (PROZAC) 20 MG capsule Take 20 mg by mouth daily.     furosemide (LASIX) 20 MG tablet Take 20 mg by mouth daily as needed. (Patient not taking: Reported on 01/09/2021)     hydroquinone 4 % cream Apply topically in the morning. (Patient not taking: Reported on 01/09/2021) 28.35 g 0   oxyCODONE-acetaminophen (PERCOCET/ROXICET) 5-325 MG tablet Take 1 tablet by mouth every 6 (six) hours as needed for moderate pain or severe pain.     rizatriptan (MAXALT) 10 MG tablet Take by mouth.     SUMAtriptan (IMITREX) 50 MG tablet Take 50 mg by mouth every 2 (two) hours as needed for migraine. May repeat in 2 hours if headache persists or recurs. (Patient  not taking: Reported on 01/09/2021)     topiramate (TOPAMAX) 25 MG tablet Take 25 mg by mouth 2 (two) times daily.     traZODone (DESYREL) 50 MG tablet Take 50 mg by mouth at bedtime.     No current facility-administered medications for this visit.   Allergies:  Sulfa antibiotics and Sulfa antibiotics   Social History: The patient  reports that she has never smoked. She has never used smokeless tobacco. She reports that she does not currently use alcohol. She reports that she does not use drugs.   Family History: The patient's family history includes Cancer in her maternal grandfather, paternal  grandfather, and paternal grandmother; Diabetes in her father.   ROS:  Please see the history of present illness. Otherwise, complete review of systems is positive for {NONE DEFAULTED:18576}.  All other systems are reviewed and negative.   Physical Exam: VS:  There were no vitals taken for this visit., BMI There is no height or weight on file to calculate BMI.  Wt Readings from Last 3 Encounters:  01/09/21 155 lb 3.2 oz (70.4 kg)  07/29/19 168 lb (76.2 kg)  04/23/18 153 lb (69.4 kg)    General: Patient appears comfortable at rest. HEENT: Conjunctiva and lids normal, oropharynx clear with moist mucosa. Neck: Supple, no elevated JVP or carotid bruits, no thyromegaly. Lungs: Clear to auscultation, nonlabored breathing at rest. Cardiac: Regular rate and rhythm, no S3 or significant systolic murmur, no pericardial rub. Abdomen: Soft, nontender, no hepatomegaly, bowel sounds present, no guarding or rebound. Extremities: No pitting edema, distal pulses 2+. Skin: Warm and dry. Musculoskeletal: No kyphosis. Neuropsychiatric: Alert and oriented x3, affect grossly appropriate.  ECG:  {EKG/Telemetry Strips Reviewed:[id number]}  Recent Labwork: No results found for requested labs within last 8760 hours.     Component Value Date/Time   CHOL 158 01/05/2014 0928   TRIG 70 01/05/2014 0928   HDL 57 01/05/2014 0928   CHOLHDL 2.8 01/05/2014 0928   VLDL 14 01/05/2014 0928   LDLCALC 87 01/05/2014 0928    Other Studies Reviewed Today:   Assessment and Plan:  1. Palpitations      Medication Adjustments/Labs and Tests Ordered: Current medicines are reviewed at length with the patient today.  Concerns regarding medicines are outlined above.   Disposition: Follow-up with ***  Signed, Levell July, NP 02/19/2021 10:54 PM    [place] [facility] [address], [place], [place] [address] Phone: [phone]; Fax: [phone]

## 2021-02-20 ENCOUNTER — Ambulatory Visit: Payer: 59 | Admitting: Family Medicine

## 2021-02-20 DIAGNOSIS — R002 Palpitations: Secondary | ICD-10-CM

## 2021-03-13 ENCOUNTER — Telehealth: Payer: Self-pay

## 2021-03-13 NOTE — Telephone Encounter (Signed)
Pt notified and verbalized understanding. Pt would like to hold off on medication at this point and monitor symptoms. Pt will call our office if symptoms persist or worsen.

## 2021-03-13 NOTE — Telephone Encounter (Signed)
-----   Message from Arnoldo Lenis, MD sent at 03/12/2021  8:50 AM EST ----- Heart monitor showed some extra heart beats at times, sometimes she can have a few in a row. This is nothing worrisome but would explain her symptoms. Its not something that has to be treated unless the symptoms remain bothersome. If ongoing symptoms could start lopressor 12.5mg  bid and keep [place] updated on symptoms   J BrancH MD

## 2021-04-03 ENCOUNTER — Ambulatory Visit (INDEPENDENT_AMBULATORY_CARE_PROVIDER_SITE_OTHER): Payer: 59 | Admitting: Adult Health

## 2021-04-03 ENCOUNTER — Encounter: Payer: Self-pay | Admitting: Adult Health

## 2021-04-03 ENCOUNTER — Other Ambulatory Visit: Payer: Self-pay

## 2021-04-03 VITALS — BP 129/82 | HR 77 | Ht 67.0 in | Wt 156.0 lb

## 2021-04-03 DIAGNOSIS — N766 Ulceration of vulva: Secondary | ICD-10-CM | POA: Insufficient documentation

## 2021-04-03 MED ORDER — VALACYCLOVIR HCL 1 G PO TABS: 1000.0000 mg | ORAL_TABLET | Freq: Two times a day (BID) | ORAL | 1 refills | Status: DC

## 2021-04-03 MED ORDER — DOXYCYCLINE HYCLATE 100 MG PO TABS: 100.0000 mg | ORAL_TABLET | Freq: Two times a day (BID) | ORAL | 0 refills | Status: DC

## 2021-04-03 NOTE — Progress Notes (Signed)
°  Subjective:     Patient ID: Michelle Gonzalez, female   DOB: [birth year], 45 y.o.   MRN: [id number]  HPI Michelle Gonzalez is a 45 year old white female,divorced, G2P1011, in complaining of painful area left inner labia, like a blister that has broken. First noticed Friday, she had sex Thursday and it was dry down there.  Lab Results  Component Value Date   DIAGPAP  07/29/2019    - Negative for intraepithelial lesion or malignancy (NILM)   HPV DETECTED (A) 07/01/2017   [place] Negative 07/29/2019    Review of Systems Painful area left inner labia,noticed Friday, had sex Thursday Reviewed past medical,surgical, social and family history. Reviewed medications and allergies.     Objective:   Physical Exam BP 129/82 (BP Location: Left Arm, Patient Position: Sitting, Cuff Size: Normal)    Pulse 77    Ht 5\' 7"  (1.702 m)    Wt 156 lb (70.8 kg)    BMI 24.43 kg/m     Skin warm and dry.Pelvic: external genitalia is normal in appearance,has oval ulceration left inner labia, HSV culture obtained,+tender to touch,  vagina: scant discharge without odor,urethra has no lesions or masses noted, cervix:smooth and bulbous, uterus: normal size, shape and contour, non tender, no masses felt, adnexa: no masses or tenderness noted. Bladder is non tender and no masses felt. Fall risk is low  Upstream - 04/03/21 0926       Pregnancy Intention Screening   Does the patient want to become pregnant in the next year? No    Does the patient's partner want to become pregnant in the next year? No    Would the patient like to discuss contraceptive options today? No      Contraception Wrap Up   Current Method Oral Contraceptive    End Method Oral Contraceptive    Contraception Counseling Provided No            Examination chaperoned by Levy Pupa LPN  Assessment:     1. Genital labial ulcer HSV culture sent to rule out herpes, may be aphthous ulcer Will rx valtrex and doxycycline Meds ordered this encounter   Medications   valACYclovir (VALTREX) 1000 MG tablet    Sig: Take 1 tablet (1,000 mg total) by mouth 2 (two) times daily.    Dispense:  20 tablet    Refill:  1    Order Specific Question:   Supervising Provider    Answer:   Tania Ade H [[id number]]   doxycycline (VIBRA-TABS) 100 MG tablet    Sig: Take 1 tablet (100 mg total) by mouth 2 (two) times daily.    Dispense:  20 tablet    Refill:  0    Order Specific Question:   Supervising Provider    Answer:   Tania Ade H [[id number]]   No sex I showed her pictures in [facility], of herpes and aphthous ulcer    Plan:     Follow up in 9 days

## 2021-04-05 LAB — SPECIMEN STATUS REPORT

## 2021-04-05 LAB — HERPES SIMPLEX VIRUS CULTURE

## 2021-04-12 ENCOUNTER — Other Ambulatory Visit: Payer: Self-pay

## 2021-04-12 ENCOUNTER — Encounter: Payer: Self-pay | Admitting: Adult Health

## 2021-04-12 ENCOUNTER — Ambulatory Visit (INDEPENDENT_AMBULATORY_CARE_PROVIDER_SITE_OTHER): Payer: 59 | Admitting: Adult Health

## 2021-04-12 VITALS — BP 114/79 | HR 73 | Ht 67.0 in | Wt 155.0 lb

## 2021-04-12 DIAGNOSIS — N766 Ulceration of vulva: Secondary | ICD-10-CM

## 2021-04-12 NOTE — Progress Notes (Signed)
°  Subjective:     Patient ID: Michelle Gonzalez, female   DOB: May 28, 1975, 45 y.o.   MRN: [id number]  HPI Michelle Gonzalez is a 45 year old white female,divorced, G2P1011, back in follow up on labial ulcer and it has resolved. Herpes culture was negative.  Lab Results  Component Value Date   DIAGPAP  07/29/2019    - Negative for intraepithelial lesion or malignancy (NILM)   HPV DETECTED (A) 07/01/2017   [place] Negative 07/29/2019   PCP is Dr Quintin Alto.  Review of Systems No pain, ulcer resolved after 3-4 days of taking meds Reviewed past medical,surgical, social and family history. Reviewed medications and allergies.     Objective:   Physical Exam BP 114/79 (BP Location: Right Arm, Patient Position: Sitting, Cuff Size: Normal)    Pulse 73    Ht 5\' 7"  (1.702 m)    Wt 155 lb (70.3 kg)    BMI 24.28 kg/m     Skin warm and dry.Pelvic: external genitalia is normal in appearance no lesions. Examination chaperoned by Celene Squibb LPN   Upstream - 08/30/44 1151       Pregnancy Intention Screening   Does the patient want to become pregnant in the next year? No    Does the patient's partner want to become pregnant in the next year? No    Would the patient like to discuss contraceptive options today? No      Contraception Wrap Up   Current Method Oral Contraceptive    End Method Oral Contraceptive    Contraception Counseling Provided No             Assessment:     1. Genital labial ulcer,resolved    Plan:     Follow up prn

## 2021-05-01 DIAGNOSIS — R69 Illness, unspecified: Secondary | ICD-10-CM | POA: Diagnosis not present

## 2021-05-01 DIAGNOSIS — M5412 Radiculopathy, cervical region: Secondary | ICD-10-CM | POA: Diagnosis not present

## 2021-05-01 DIAGNOSIS — M47816 Spondylosis without myelopathy or radiculopathy, lumbar region: Secondary | ICD-10-CM | POA: Diagnosis not present

## 2021-05-01 DIAGNOSIS — F1120 Opioid dependence, uncomplicated: Secondary | ICD-10-CM | POA: Diagnosis not present

## 2021-05-07 ENCOUNTER — Other Ambulatory Visit: Payer: Self-pay | Admitting: Adult Health

## 2021-05-09 ENCOUNTER — Other Ambulatory Visit: Payer: Self-pay | Admitting: Pain Medicine

## 2021-05-09 DIAGNOSIS — M47816 Spondylosis without myelopathy or radiculopathy, lumbar region: Secondary | ICD-10-CM

## 2021-05-24 DIAGNOSIS — M47816 Spondylosis without myelopathy or radiculopathy, lumbar region: Secondary | ICD-10-CM | POA: Diagnosis not present

## 2021-06-05 ENCOUNTER — Ambulatory Visit
Admission: RE | Admit: 2021-06-05 | Discharge: 2021-06-05 | Disposition: A | Discharge status: Home / Self Care | Payer: Self-pay | Source: Ambulatory Visit | Attending: Pain Medicine | Admitting: Pain Medicine

## 2021-06-05 ENCOUNTER — Other Ambulatory Visit: Payer: Self-pay

## 2021-06-05 DIAGNOSIS — M47816 Spondylosis without myelopathy or radiculopathy, lumbar region: Secondary | ICD-10-CM

## 2021-06-05 DIAGNOSIS — M542 Cervicalgia: Secondary | ICD-10-CM | POA: Diagnosis not present

## 2021-06-19 DIAGNOSIS — M47816 Spondylosis without myelopathy or radiculopathy, lumbar region: Secondary | ICD-10-CM | POA: Diagnosis not present

## 2021-06-19 DIAGNOSIS — M79641 Pain in right hand: Secondary | ICD-10-CM | POA: Diagnosis not present

## 2021-06-19 DIAGNOSIS — M5412 Radiculopathy, cervical region: Secondary | ICD-10-CM | POA: Diagnosis not present

## 2021-06-19 DIAGNOSIS — M79642 Pain in left hand: Secondary | ICD-10-CM | POA: Diagnosis not present

## 2021-06-20 ENCOUNTER — Other Ambulatory Visit: Payer: Self-pay | Admitting: Adult Health

## 2021-06-26 DIAGNOSIS — G5603 Carpal tunnel syndrome, bilateral upper limbs: Secondary | ICD-10-CM | POA: Diagnosis not present

## 2021-07-10 DIAGNOSIS — M47816 Spondylosis without myelopathy or radiculopathy, lumbar region: Secondary | ICD-10-CM | POA: Diagnosis not present

## 2021-07-10 DIAGNOSIS — G5603 Carpal tunnel syndrome, bilateral upper limbs: Secondary | ICD-10-CM | POA: Diagnosis not present

## 2021-07-11 DIAGNOSIS — I10 Essential (primary) hypertension: Secondary | ICD-10-CM | POA: Diagnosis not present

## 2021-07-11 DIAGNOSIS — R69 Illness, unspecified: Secondary | ICD-10-CM | POA: Diagnosis not present

## 2021-07-11 DIAGNOSIS — Z6825 Body mass index (BMI) 25.0-25.9, adult: Secondary | ICD-10-CM | POA: Diagnosis not present

## 2021-07-11 DIAGNOSIS — F32A Depression, unspecified: Secondary | ICD-10-CM | POA: Diagnosis not present

## 2021-07-12 DIAGNOSIS — M47816 Spondylosis without myelopathy or radiculopathy, lumbar region: Secondary | ICD-10-CM | POA: Diagnosis not present

## 2021-08-28 DIAGNOSIS — G5600 Carpal tunnel syndrome, unspecified upper limb: Secondary | ICD-10-CM | POA: Diagnosis not present

## 2021-08-28 DIAGNOSIS — M47812 Spondylosis without myelopathy or radiculopathy, cervical region: Secondary | ICD-10-CM | POA: Diagnosis not present

## 2021-08-29 HISTORY — PX: CARPAL TUNNEL RELEASE: SHX101

## 2021-09-05 DIAGNOSIS — G5601 Carpal tunnel syndrome, right upper limb: Secondary | ICD-10-CM | POA: Diagnosis not present

## 2021-09-20 DIAGNOSIS — R69 Illness, unspecified: Secondary | ICD-10-CM | POA: Diagnosis not present

## 2021-09-20 DIAGNOSIS — Z6824 Body mass index (BMI) 24.0-24.9, adult: Secondary | ICD-10-CM | POA: Diagnosis not present

## 2021-09-20 DIAGNOSIS — F332 Major depressive disorder, recurrent severe without psychotic features: Secondary | ICD-10-CM | POA: Diagnosis not present

## 2021-09-20 DIAGNOSIS — G4700 Insomnia, unspecified: Secondary | ICD-10-CM | POA: Diagnosis not present

## 2021-09-20 DIAGNOSIS — I10 Essential (primary) hypertension: Secondary | ICD-10-CM | POA: Diagnosis not present

## 2021-10-29 ENCOUNTER — Ambulatory Visit (INDEPENDENT_AMBULATORY_CARE_PROVIDER_SITE_OTHER): Payer: 59 | Admitting: Adult Health

## 2021-10-29 ENCOUNTER — Other Ambulatory Visit (HOSPITAL_COMMUNITY)
Admission: RE | Admit: 2021-10-29 | Discharge: 2021-10-29 | Disposition: A | Discharge status: Home / Self Care | Payer: 59 | Source: Ambulatory Visit | Attending: Adult Health | Admitting: Adult Health

## 2021-10-29 ENCOUNTER — Encounter: Payer: Self-pay | Admitting: Adult Health

## 2021-10-29 VITALS — BP 139/90 | HR 77 | Ht 67.0 in | Wt 151.0 lb

## 2021-10-29 DIAGNOSIS — R300 Dysuria: Secondary | ICD-10-CM | POA: Diagnosis not present

## 2021-10-29 DIAGNOSIS — Z113 Encounter for screening for infections with a predominantly sexual mode of transmission: Secondary | ICD-10-CM | POA: Diagnosis not present

## 2021-10-29 DIAGNOSIS — B3731 Acute candidiasis of vulva and vagina: Secondary | ICD-10-CM | POA: Diagnosis not present

## 2021-10-29 LAB — POCT URINALYSIS DIPSTICK
Glucose, UA: NEGATIVE
Ketones, UA: NEGATIVE
Nitrite, UA: NEGATIVE
Protein, UA: NEGATIVE

## 2021-10-29 NOTE — Progress Notes (Signed)
  Subjective:     Patient ID: Michelle Gonzalez, female   DOB: March 10, 1976, 46 y.o.   MRN: [id number]  HPI [name] is a 46 year old white female,single, G2P1011 in complaining of burning with urination on and  off and requests STD testing. She had broken up with partner and he had sex with someone else and had some burning, was treated with meds from on line MD.  Lab Results  Component Value Date   DIAGPAP  07/29/2019    - Negative for intraepithelial lesion or malignancy (NILM)   HPV DETECTED (A) 07/01/2017   [place] Negative 07/29/2019   PCP is Dr Quintin Alto  Review of Systems Has burning on and off with urination Reviewed past medical,surgical, social and family history. Reviewed medications and allergies.     Objective:   Physical Exam BP 139/90 (BP Location: Left Arm, Patient Position: Sitting, Cuff Size: Normal)   Pulse 77   Ht '5\' 7"'$  (1.702 m)   Wt 151 lb (68.5 kg)   BMI 23.65 kg/m  urine dipstick trace blood and leuks.   Skin warm and dry.Pelvic: external genitalia is normal in appearance no lesions, vagina:scant discharge with out odor,urethra has no lesions or masses noted, cervix:smooth and bulbous, uterus: normal size, shape and contour, non tender, no masses felt, adnexa: no masses or tenderness noted. Bladder is non tender and no masses felt. CV swab obtained. Fall risk is low  Upstream - 10/29/21 1451       Pregnancy Intention Screening   Does the patient want to become pregnant in the next year? No    Does the patient's partner want to become pregnant in the next year? No    Would the patient like to discuss contraceptive options today? No      Contraception Wrap Up   Current Method Oral Contraceptive    End Method Oral Contraceptive            Examination chaperoned by Levy Pupa LPN  Assessment:      1. Burning with urination Has burning on and off with urination Will send urine for UA C&S to rule out UTI - POCT Urinalysis Dipstick - Urine Culture -  Urinalysis, Routine w reflex microscopic  2. Screening examination for STD (sexually transmitted disease) CV swab sent for GC/CHL,trich, BV and yeast  She declines blood labs - Cervicovaginal ancillary only( [place])     Plan:     Follow up prn

## 2021-10-30 LAB — URINALYSIS, ROUTINE W REFLEX MICROSCOPIC
Bilirubin, UA: NEGATIVE
Glucose, UA: NEGATIVE
Ketones, UA: NEGATIVE
Nitrite, UA: NEGATIVE
Protein,UA: NEGATIVE
RBC, UA: NEGATIVE
Specific Gravity, UA: 1.021 (ref 1.005–1.030)
Urobilinogen, Ur: 0.2 mg/dL (ref 0.2–1.0)
pH, UA: 6 (ref 5.0–7.5)

## 2021-10-30 LAB — MICROSCOPIC EXAMINATION
Casts: NONE SEEN /lpf
RBC, Urine: NONE SEEN /hpf (ref 0–2)

## 2021-10-31 ENCOUNTER — Other Ambulatory Visit: Payer: Self-pay | Admitting: Adult Health

## 2021-10-31 LAB — CERVICOVAGINAL ANCILLARY ONLY
Bacterial Vaginitis (gardnerella): NEGATIVE
Candida Glabrata: NEGATIVE
Candida Vaginitis: POSITIVE — AB
Chlamydia: NEGATIVE
Comment: NEGATIVE
Comment: NEGATIVE
Comment: NEGATIVE
Comment: NEGATIVE
Comment: NEGATIVE
Comment: NORMAL
Neisseria Gonorrhea: NEGATIVE
Trichomonas: NEGATIVE

## 2021-11-01 ENCOUNTER — Other Ambulatory Visit: Payer: Self-pay | Admitting: Adult Health

## 2021-11-01 LAB — URINE CULTURE

## 2021-11-01 MED ORDER — NITROFURANTOIN MONOHYD MACRO 100 MG PO CAPS: 100.0000 mg | ORAL_CAPSULE | Freq: Two times a day (BID) | ORAL | 0 refills | Status: DC

## 2021-11-15 DIAGNOSIS — R030 Elevated blood-pressure reading, without diagnosis of hypertension: Secondary | ICD-10-CM | POA: Diagnosis not present

## 2021-11-15 DIAGNOSIS — G43009 Migraine without aura, not intractable, without status migrainosus: Secondary | ICD-10-CM | POA: Diagnosis not present

## 2021-11-15 DIAGNOSIS — G4700 Insomnia, unspecified: Secondary | ICD-10-CM | POA: Diagnosis not present

## 2021-11-15 DIAGNOSIS — R69 Illness, unspecified: Secondary | ICD-10-CM | POA: Diagnosis not present

## 2021-11-15 DIAGNOSIS — Z6824 Body mass index (BMI) 24.0-24.9, adult: Secondary | ICD-10-CM | POA: Diagnosis not present

## 2021-11-15 DIAGNOSIS — R300 Dysuria: Secondary | ICD-10-CM | POA: Diagnosis not present

## 2021-11-28 ENCOUNTER — Telehealth: Payer: Self-pay | Admitting: *Deleted

## 2021-11-28 NOTE — Chronic Care Management (AMB) (Signed)
  Care Coordination  Outreach Note  11/28/2021 Name: ANUSHKA HARTINGER MRN: [id number] DOB: [birth year]   Care Coordination Outreach Attempts  An unsuccessful telephone outreach was attempted today to offer the patient information about available care coordination services as a benefit of their health plan.   Follow Up Plan:  Additional outreach attempts will be made to offer the patient care coordination information and services.   Encounter Outcome:  No Answer  [place]  Direct Dial: [phone]

## 2021-12-06 NOTE — Chronic Care Management (AMB) (Signed)
  Care Coordination   Note   12/06/2021 Name: Michelle Gonzalez MRN: [id number] DOB: 1976/01/26  [name] is a 46 y.o. year old female who sees Sasser, Silvestre Moment, MD for primary care. I reached out to Janeann Forehand by phone today to offer care coordination services.  [name] was given information about Care Coordination services today including:   The Care Coordination services include support from the care team which includes your Nurse Coordinator, Clinical Social Worker, or Pharmacist.  The Care Coordination team is here to help remove barriers to the health concerns and goals most important to you. Care Coordination services are voluntary, and the patient may decline or stop services at any time by request to their care team member.   Care Coordination Consent Status: Patient did not agree to participate in care coordination services at this time.    Encounter Outcome:  Pt. Refused  [place]  Direct Dial: [phone]

## 2022-01-03 ENCOUNTER — Ambulatory Visit (INDEPENDENT_AMBULATORY_CARE_PROVIDER_SITE_OTHER): Payer: 59 | Admitting: Adult Health

## 2022-01-03 ENCOUNTER — Encounter: Payer: Self-pay | Admitting: Adult Health

## 2022-01-03 VITALS — BP 128/89 | HR 71 | Ht 67.0 in | Wt 157.0 lb

## 2022-01-03 DIAGNOSIS — Z20828 Contact with and (suspected) exposure to other viral communicable diseases: Secondary | ICD-10-CM | POA: Insufficient documentation

## 2022-01-03 MED ORDER — VALACYCLOVIR HCL 1 G PO TABS: 1000.0000 mg | ORAL_TABLET | Freq: Two times a day (BID) | ORAL | 1 refills | Status: DC

## 2022-01-03 NOTE — Progress Notes (Signed)
  Subjective:     Patient ID: [name], female   DOB: 02/17/1976, 46 y.o.   MRN: [id number]  HPI Michelle Gonzalez is a 46 years old white female, single, G2P1011 in complaining of sore, painful area right inner labia last week, had 1 blister,did tingle and itch a little, took 2 valtrex and blister resolved. Boyfriend had patch of blisters above penis recently.  Last pap was negative for malignancy and HPV 07/29/19  PCP is Dr Quintin Alto  Review of Systems Had painful area right inner labia last week and 1 blister, blister resolved after 2 valtrex Reviewed past medical,surgical, social and family history. Reviewed medications and allergies.     Objective:   Physical Exam BP 128/89 (BP Location: Left Arm, Patient Position: Sitting, Cuff Size: Normal)   Pulse 71   Ht '5\' 7"'$  (1.702 m)   Wt 157 lb (71.2 kg)   BMI 24.59 kg/m     Skin warm and dry.Pelvic: external genitalia is normal in appearance no lesions, vagina has swollen area right inner labia, no blister now. Fall risk is low  Upstream - 01/03/22 1413       Pregnancy Intention Screening   Does the patient want to become pregnant in the next year? No    Does the patient's partner want to become pregnant in the next year? No    Would the patient like to discuss contraceptive options today? No      Contraception Wrap Up   Current Method Oral Contraceptive    End Method Oral Contraceptive            Examination chaperoned by Levy Pupa LPN  Assessment:      1. Herpes exposure Had sore area right inner labia last week, ?1 blister, took 2 valtrex, blister resolved Will rx valtrex Meds ordered this encounter  Medications   valACYclovir (VALTREX) 1000 MG tablet    Sig: Take 1 tablet (1,000 mg total) by mouth 2 (two) times daily. Take 1 bid x 10 days    Dispense:  20 tablet    Refill:  1    Order Specific Question:   Supervising Provider    Answer:   Florian Buff [[id number]]   She wants labs in 3 months     Plan:     Follow up  prn

## 2022-01-16 DIAGNOSIS — G5602 Carpal tunnel syndrome, left upper limb: Secondary | ICD-10-CM | POA: Diagnosis not present

## 2022-01-23 DIAGNOSIS — M542 Cervicalgia: Secondary | ICD-10-CM | POA: Diagnosis not present

## 2022-01-23 DIAGNOSIS — M47816 Spondylosis without myelopathy or radiculopathy, lumbar region: Secondary | ICD-10-CM | POA: Diagnosis not present

## 2022-01-23 DIAGNOSIS — M79641 Pain in right hand: Secondary | ICD-10-CM | POA: Diagnosis not present

## 2022-02-04 DIAGNOSIS — M47816 Spondylosis without myelopathy or radiculopathy, lumbar region: Secondary | ICD-10-CM | POA: Diagnosis not present

## 2022-02-14 DIAGNOSIS — G43009 Migraine without aura, not intractable, without status migrainosus: Secondary | ICD-10-CM | POA: Diagnosis not present

## 2022-02-14 DIAGNOSIS — S5001XA Contusion of right elbow, initial encounter: Secondary | ICD-10-CM | POA: Diagnosis not present

## 2022-02-14 DIAGNOSIS — G4700 Insomnia, unspecified: Secondary | ICD-10-CM | POA: Diagnosis not present

## 2022-02-14 DIAGNOSIS — R030 Elevated blood-pressure reading, without diagnosis of hypertension: Secondary | ICD-10-CM | POA: Diagnosis not present

## 2022-03-05 DIAGNOSIS — S52044D Nondisplaced fracture of coronoid process of right ulna, subsequent encounter for closed fracture with routine healing: Secondary | ICD-10-CM | POA: Diagnosis not present

## 2022-03-05 DIAGNOSIS — S42431A Displaced fracture (avulsion) of lateral epicondyle of right humerus, initial encounter for closed fracture: Secondary | ICD-10-CM | POA: Diagnosis not present

## 2022-03-06 ENCOUNTER — Other Ambulatory Visit: Payer: Self-pay | Admitting: Family Medicine

## 2022-03-06 DIAGNOSIS — S42431A Displaced fracture (avulsion) of lateral epicondyle of right humerus, initial encounter for closed fracture: Secondary | ICD-10-CM

## 2022-03-06 DIAGNOSIS — S52044D Nondisplaced fracture of coronoid process of right ulna, subsequent encounter for closed fracture with routine healing: Secondary | ICD-10-CM

## 2022-03-19 ENCOUNTER — Other Ambulatory Visit: Payer: Self-pay | Admitting: Adult Health

## 2022-03-20 DIAGNOSIS — M47816 Spondylosis without myelopathy or radiculopathy, lumbar region: Secondary | ICD-10-CM | POA: Diagnosis not present

## 2022-03-20 DIAGNOSIS — G5603 Carpal tunnel syndrome, bilateral upper limbs: Secondary | ICD-10-CM | POA: Diagnosis not present

## 2022-04-01 ENCOUNTER — Other Ambulatory Visit: Payer: Medicare Other

## 2022-04-18 DIAGNOSIS — S42431A Displaced fracture (avulsion) of lateral epicondyle of right humerus, initial encounter for closed fracture: Secondary | ICD-10-CM | POA: Diagnosis not present

## 2022-04-18 DIAGNOSIS — G43009 Migraine without aura, not intractable, without status migrainosus: Secondary | ICD-10-CM | POA: Diagnosis not present

## 2022-04-18 DIAGNOSIS — R030 Elevated blood-pressure reading, without diagnosis of hypertension: Secondary | ICD-10-CM | POA: Diagnosis not present

## 2022-04-18 DIAGNOSIS — G4700 Insomnia, unspecified: Secondary | ICD-10-CM | POA: Diagnosis not present

## 2022-04-24 ENCOUNTER — Ambulatory Visit
Admission: RE | Admit: 2022-04-24 | Discharge: 2022-04-24 | Disposition: A | Discharge status: Home / Self Care | Payer: Medicare Other | Source: Ambulatory Visit | Attending: Family Medicine | Admitting: Family Medicine

## 2022-04-24 DIAGNOSIS — S56511A Strain of other extensor muscle, fascia and tendon at forearm level, right arm, initial encounter: Secondary | ICD-10-CM | POA: Diagnosis not present

## 2022-04-24 DIAGNOSIS — S52044D Nondisplaced fracture of coronoid process of right ulna, subsequent encounter for closed fracture with routine healing: Secondary | ICD-10-CM

## 2022-04-24 DIAGNOSIS — S53431A Radial collateral ligament sprain of right elbow, initial encounter: Secondary | ICD-10-CM | POA: Diagnosis not present

## 2022-04-24 DIAGNOSIS — S42431A Displaced fracture (avulsion) of lateral epicondyle of right humerus, initial encounter for closed fracture: Secondary | ICD-10-CM

## 2022-05-26 DIAGNOSIS — R091 Pleurisy: Secondary | ICD-10-CM | POA: Diagnosis not present

## 2022-05-26 DIAGNOSIS — R079 Chest pain, unspecified: Secondary | ICD-10-CM | POA: Diagnosis not present

## 2022-05-26 DIAGNOSIS — R0602 Shortness of breath: Secondary | ICD-10-CM | POA: Diagnosis not present

## 2022-05-26 DIAGNOSIS — Z882 Allergy status to sulfonamides status: Secondary | ICD-10-CM | POA: Diagnosis not present

## 2022-06-19 DIAGNOSIS — G43711 Chronic migraine without aura, intractable, with status migrainosus: Secondary | ICD-10-CM | POA: Diagnosis not present

## 2022-06-19 DIAGNOSIS — M47816 Spondylosis without myelopathy or radiculopathy, lumbar region: Secondary | ICD-10-CM | POA: Diagnosis not present

## 2022-06-19 DIAGNOSIS — R29898 Other symptoms and signs involving the musculoskeletal system: Secondary | ICD-10-CM | POA: Diagnosis not present

## 2022-06-19 DIAGNOSIS — M5416 Radiculopathy, lumbar region: Secondary | ICD-10-CM | POA: Diagnosis not present

## 2022-07-02 DIAGNOSIS — M5416 Radiculopathy, lumbar region: Secondary | ICD-10-CM | POA: Diagnosis not present

## 2022-07-11 DIAGNOSIS — G4700 Insomnia, unspecified: Secondary | ICD-10-CM | POA: Diagnosis not present

## 2022-07-11 DIAGNOSIS — R030 Elevated blood-pressure reading, without diagnosis of hypertension: Secondary | ICD-10-CM | POA: Diagnosis not present

## 2022-07-11 DIAGNOSIS — G43009 Migraine without aura, not intractable, without status migrainosus: Secondary | ICD-10-CM | POA: Diagnosis not present

## 2022-08-12 DIAGNOSIS — M5416 Radiculopathy, lumbar region: Secondary | ICD-10-CM | POA: Diagnosis not present

## 2022-10-03 DIAGNOSIS — M5416 Radiculopathy, lumbar region: Secondary | ICD-10-CM | POA: Diagnosis not present

## 2022-10-07 DIAGNOSIS — I10 Essential (primary) hypertension: Secondary | ICD-10-CM | POA: Diagnosis not present

## 2022-10-07 DIAGNOSIS — R030 Elevated blood-pressure reading, without diagnosis of hypertension: Secondary | ICD-10-CM | POA: Diagnosis not present

## 2022-10-07 DIAGNOSIS — G43009 Migraine without aura, not intractable, without status migrainosus: Secondary | ICD-10-CM | POA: Diagnosis not present

## 2022-10-07 DIAGNOSIS — R739 Hyperglycemia, unspecified: Secondary | ICD-10-CM | POA: Diagnosis not present

## 2022-10-07 DIAGNOSIS — G4700 Insomnia, unspecified: Secondary | ICD-10-CM | POA: Diagnosis not present

## 2022-11-06 DIAGNOSIS — R030 Elevated blood-pressure reading, without diagnosis of hypertension: Secondary | ICD-10-CM | POA: Diagnosis not present

## 2022-11-06 DIAGNOSIS — G4700 Insomnia, unspecified: Secondary | ICD-10-CM | POA: Diagnosis not present

## 2022-11-06 DIAGNOSIS — R739 Hyperglycemia, unspecified: Secondary | ICD-10-CM | POA: Diagnosis not present

## 2022-11-06 DIAGNOSIS — Z1389 Encounter for screening for other disorder: Secondary | ICD-10-CM | POA: Diagnosis not present

## 2022-11-06 DIAGNOSIS — G43009 Migraine without aura, not intractable, without status migrainosus: Secondary | ICD-10-CM | POA: Diagnosis not present

## 2022-11-14 DIAGNOSIS — M5416 Radiculopathy, lumbar region: Secondary | ICD-10-CM | POA: Diagnosis not present

## 2023-01-23 DIAGNOSIS — M5416 Radiculopathy, lumbar region: Secondary | ICD-10-CM | POA: Diagnosis not present

## 2023-02-02 ENCOUNTER — Other Ambulatory Visit: Payer: Self-pay | Admitting: Adult Health

## 2023-02-05 DIAGNOSIS — G43009 Migraine without aura, not intractable, without status migrainosus: Secondary | ICD-10-CM | POA: Diagnosis not present

## 2023-02-25 DIAGNOSIS — R5383 Other fatigue: Secondary | ICD-10-CM | POA: Diagnosis not present

## 2023-02-25 DIAGNOSIS — G43909 Migraine, unspecified, not intractable, without status migrainosus: Secondary | ICD-10-CM | POA: Diagnosis not present

## 2023-02-25 DIAGNOSIS — R079 Chest pain, unspecified: Secondary | ICD-10-CM | POA: Diagnosis not present

## 2023-02-25 DIAGNOSIS — M26622 Arthralgia of left temporomandibular joint: Secondary | ICD-10-CM | POA: Diagnosis not present

## 2023-02-25 DIAGNOSIS — R0602 Shortness of breath: Secondary | ICD-10-CM | POA: Diagnosis not present

## 2023-03-11 DIAGNOSIS — R079 Chest pain, unspecified: Secondary | ICD-10-CM | POA: Diagnosis not present

## 2023-03-11 DIAGNOSIS — R0602 Shortness of breath: Secondary | ICD-10-CM | POA: Diagnosis not present

## 2023-03-11 DIAGNOSIS — R5383 Other fatigue: Secondary | ICD-10-CM | POA: Diagnosis not present

## 2023-03-12 ENCOUNTER — Other Ambulatory Visit: Payer: Self-pay | Admitting: Adult Health

## 2023-03-26 DIAGNOSIS — M542 Cervicalgia: Secondary | ICD-10-CM | POA: Diagnosis not present

## 2023-03-26 DIAGNOSIS — M5416 Radiculopathy, lumbar region: Secondary | ICD-10-CM | POA: Diagnosis not present

## 2023-04-28 DIAGNOSIS — M5416 Radiculopathy, lumbar region: Secondary | ICD-10-CM | POA: Diagnosis not present

## 2023-05-13 DIAGNOSIS — G43909 Migraine, unspecified, not intractable, without status migrainosus: Secondary | ICD-10-CM | POA: Diagnosis not present

## 2023-06-11 DIAGNOSIS — M5416 Radiculopathy, lumbar region: Secondary | ICD-10-CM | POA: Diagnosis not present

## 2023-06-11 DIAGNOSIS — M5412 Radiculopathy, cervical region: Secondary | ICD-10-CM | POA: Diagnosis not present

## 2023-06-11 DIAGNOSIS — G43711 Chronic migraine without aura, intractable, with status migrainosus: Secondary | ICD-10-CM | POA: Diagnosis not present

## 2023-06-13 ENCOUNTER — Other Ambulatory Visit: Payer: Self-pay | Admitting: Pain Medicine

## 2023-06-13 DIAGNOSIS — M5412 Radiculopathy, cervical region: Secondary | ICD-10-CM

## 2023-06-13 DIAGNOSIS — G43909 Migraine, unspecified, not intractable, without status migrainosus: Secondary | ICD-10-CM | POA: Diagnosis not present

## 2023-06-19 ENCOUNTER — Ambulatory Visit
Admission: RE | Admit: 2023-06-19 | Discharge: 2023-06-19 | Disposition: A | Discharge status: Home / Self Care | Source: Ambulatory Visit | Attending: Pain Medicine | Admitting: Pain Medicine

## 2023-06-19 DIAGNOSIS — M5412 Radiculopathy, cervical region: Secondary | ICD-10-CM | POA: Diagnosis not present

## 2023-06-19 DIAGNOSIS — M4802 Spinal stenosis, cervical region: Secondary | ICD-10-CM | POA: Diagnosis not present

## 2023-06-19 MED ORDER — GADOPICLENOL 0.5 MMOL/ML IV SOLN
7.0000 mL | Freq: Once | INTRAVENOUS | Status: AC | PRN
  Administered 2023-06-19: 7 mL via INTRAVENOUS

## 2023-06-23 ENCOUNTER — Telehealth: Payer: Self-pay | Admitting: Adult Health

## 2023-06-23 MED ORDER — FLUCONAZOLE 150 MG PO TABS: ORAL_TABLET | ORAL | 6 refills | Status: DC

## 2023-06-23 NOTE — Telephone Encounter (Signed)
 Refilled diflucan

## 2023-06-23 NOTE — Addendum Note (Signed)
 Addended by: Cyril Mourning A on: 06/23/2023 05:23 PM   Modules accepted: Orders

## 2023-06-23 NOTE — Telephone Encounter (Addendum)
 Pt knows she has a yeast infection. She is requesting Diflucan. Thanks! JSY

## 2023-06-23 NOTE — Telephone Encounter (Signed)
 Pt is requesting Difulan to be sent to her pharmacy.

## 2023-07-28 ENCOUNTER — Telehealth: Payer: Self-pay | Admitting: Neurology

## 2023-07-29 ENCOUNTER — Ambulatory Visit: Payer: Medicare Other | Admitting: Neurology

## 2023-08-04 DIAGNOSIS — M5416 Radiculopathy, lumbar region: Secondary | ICD-10-CM | POA: Diagnosis not present

## 2023-08-06 NOTE — Telephone Encounter (Signed)
 error

## 2023-08-07 DIAGNOSIS — K5909 Other constipation: Secondary | ICD-10-CM | POA: Diagnosis not present

## 2023-08-07 DIAGNOSIS — K219 Gastro-esophageal reflux disease without esophagitis: Secondary | ICD-10-CM | POA: Diagnosis not present

## 2023-08-07 DIAGNOSIS — R300 Dysuria: Secondary | ICD-10-CM | POA: Diagnosis not present

## 2023-08-21 IMAGING — MR MR CERVICAL SPINE W/O CM
4 of 5 series · 29 of 48 positions shown · non-contrast
Comparison: 05/31/2019

CLINICAL DATA: Neck pain radiating to bilateral posterior
shoulders, surgery in 3639

EXAM:
MRI CERVICAL SPINE WITHOUT CONTRAST
TECHNIQUE: Multiplanar, multisequence MR imaging of the cervical spine was
performed. No intravenous contrast was administered.

[Series 2: T2 · sagittal · 3.0mm · 0.72mm/px · 7 of 15 slices shown (1 of 2)]
[im 1/15]
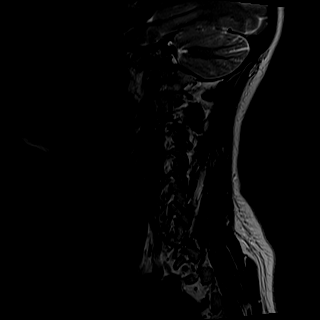
[im 3/15]
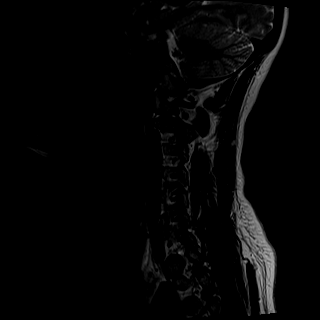
[im 5/15]
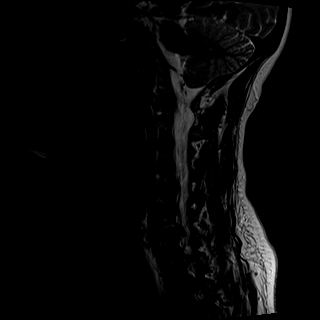
[im 8/15]
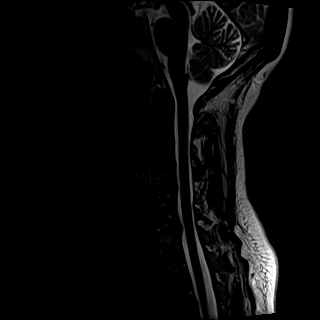
[im 10/15]
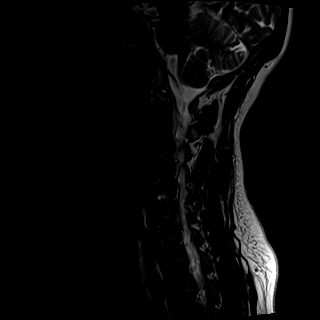
[im 12/15]
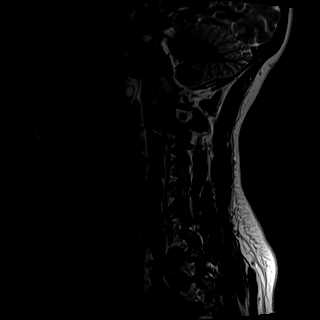
[im 15/15]
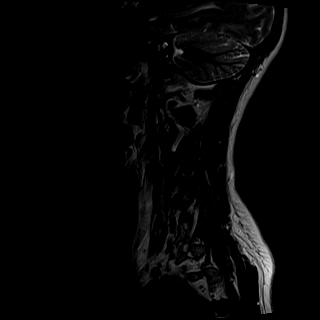

[Series 3: T1 · sagittal · 3.0mm · 0.45mm/px · 7 of 15 slices shown]
[im 1/15]
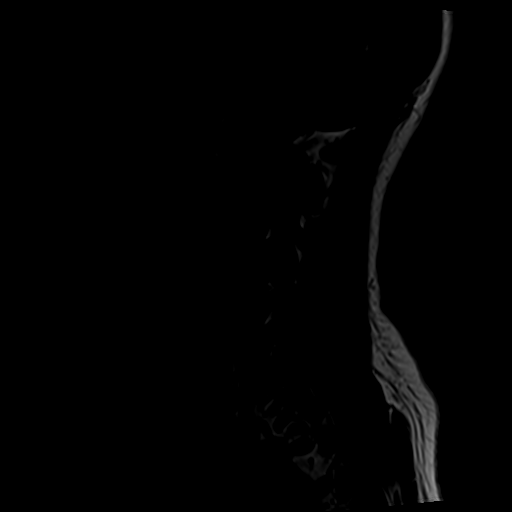
[im 3/15]
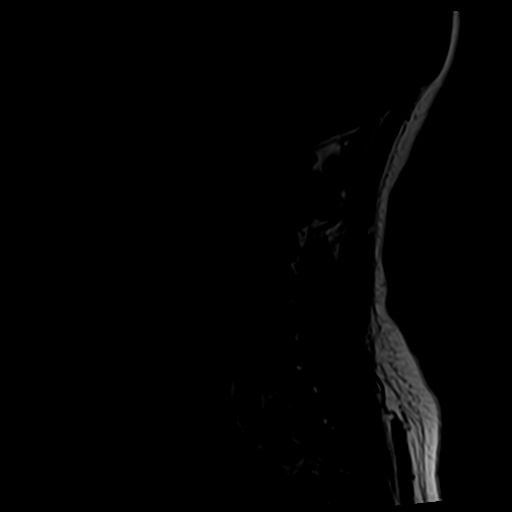
[im 5/15]
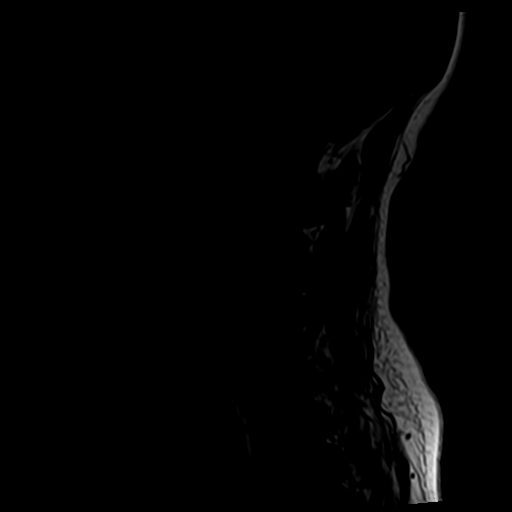
[im 8/15]
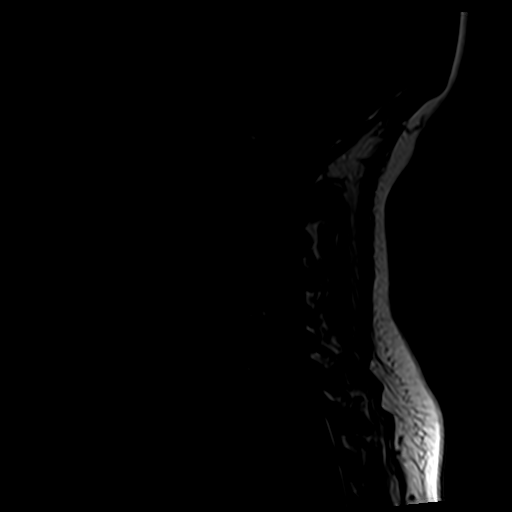
[im 10/15]
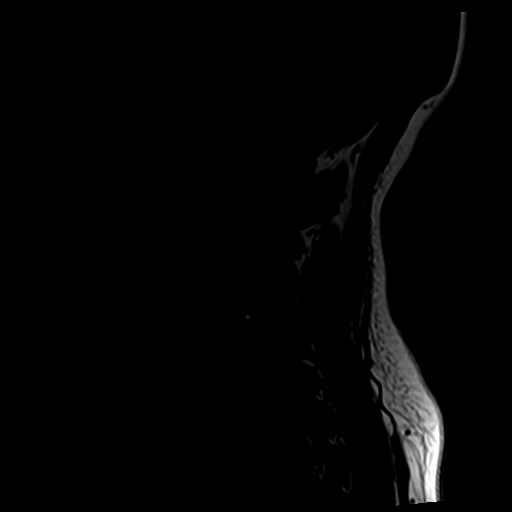
[im 12/15]
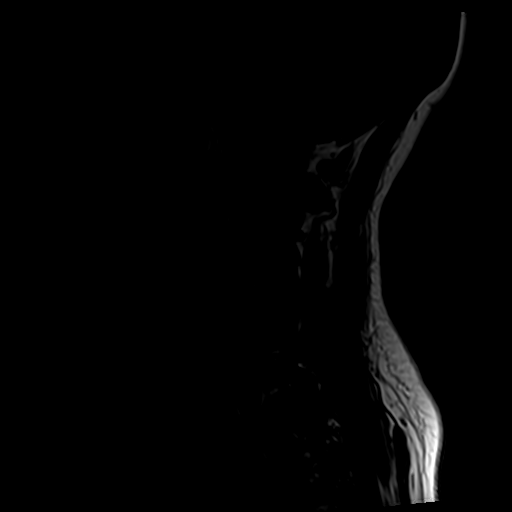
[im 15/15]
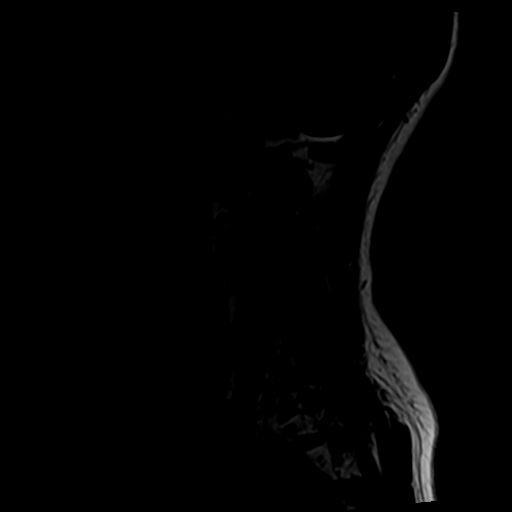

[Series 4: tir sag · sagittal · 3.0mm · 0.45mm/px · 6 of 15 slices shown]
[im 1/15]
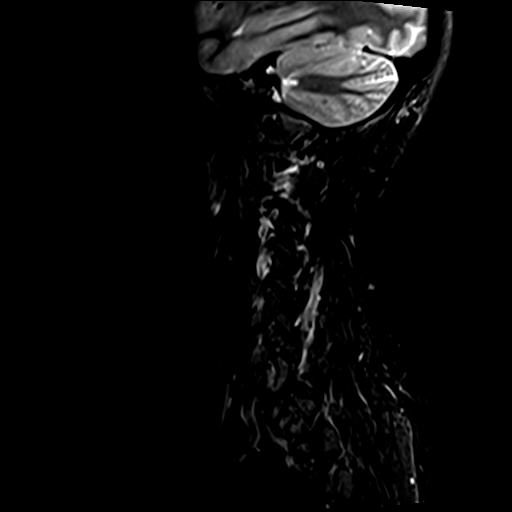
[im 3/15]
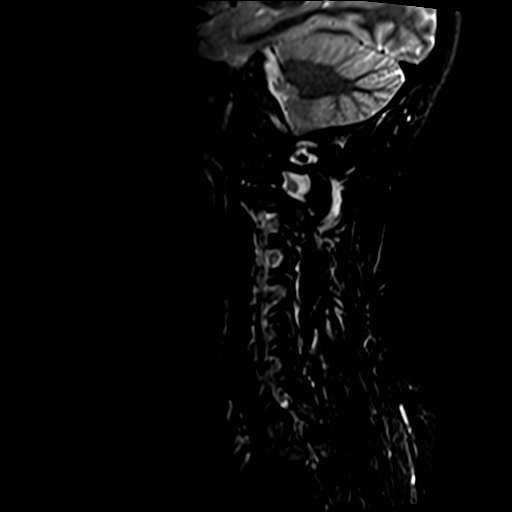
[im 5/15]
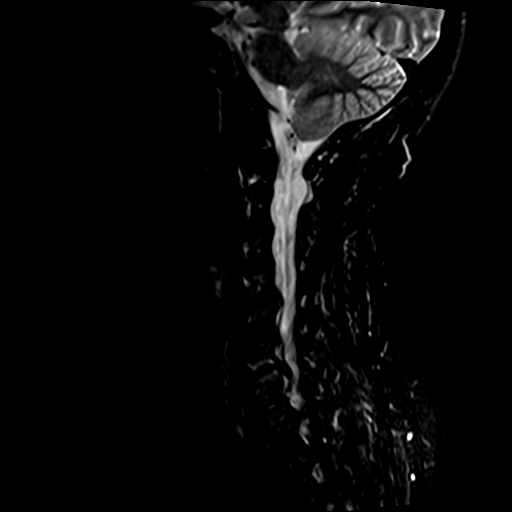
[im 8/15]
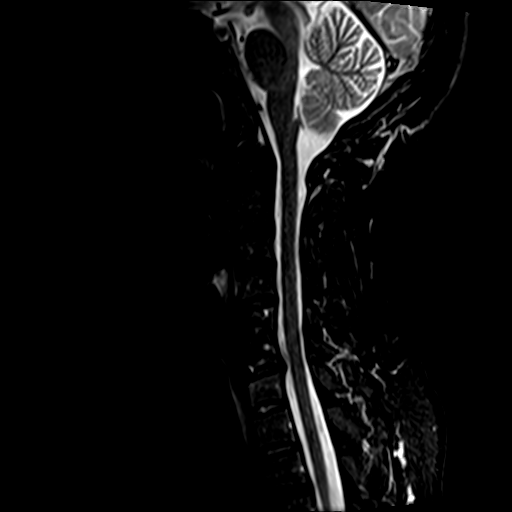
[im 10/15]
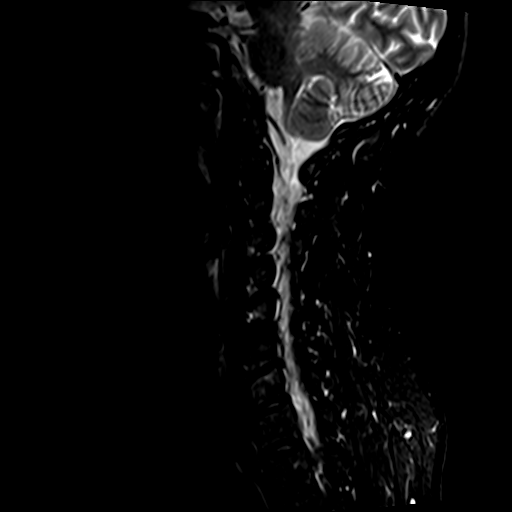
[im 12/15]
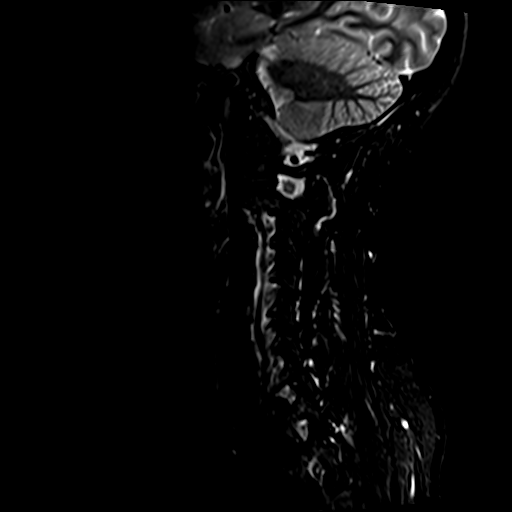

[Series 6: T2 · axial · 3.0mm · 0.70mm/px · z∈[-83,+19]mm · 9 of 27 slices shown (2 of 2)]
[im 1/27]
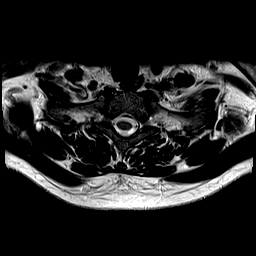
[im 5/27]
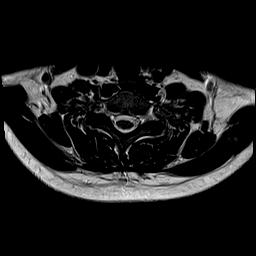
[im 9/27]
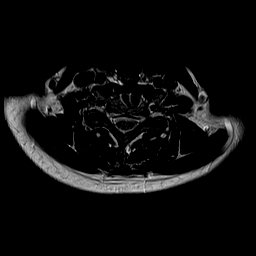
[im 11/27]
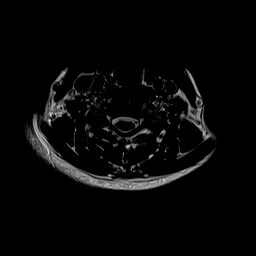
[im 14/27]
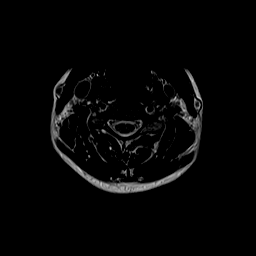
[im 16/27]
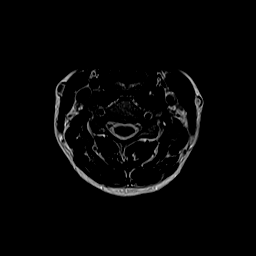
[im 18/27]
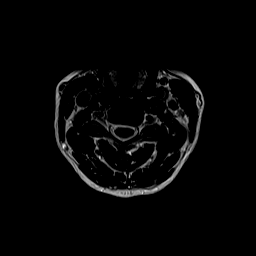
[im 22/27]
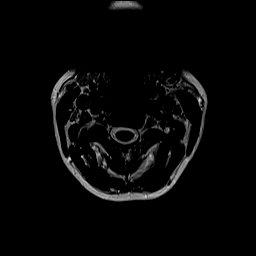
[im 27/27]
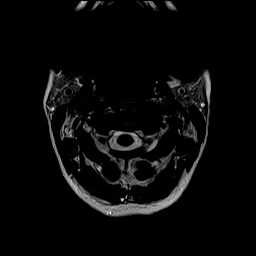

[29 of 48 positions shown; findings below may reference images not displayed]

FINDINGS: Alignment: Straightening of the normal cervical lordosis.

Vertebrae: Status post interval ACDF C5-C6. No acute fracture or
suspicious osseous lesion.

Cord: Normal signal and morphology.

Posterior Fossa, vertebral arteries, paraspinal tissues: Negative.

Disc levels:

C2-C3: No significant disc bulge. No spinal canal stenosis or
neuroforaminal narrowing.

C3-C4: No significant disc bulge. No spinal canal stenosis or
neuroforaminal narrowing.

C4-C5: Mild disc bulge. No spinal canal stenosis or neural foraminal
narrowing.

C5-C6: Status post ACDF. No spinal canal stenosis or neural
foraminal narrowing.

C6-C7: Redemonstrated moderate disc bulge with superimposed right
subarticular/foraminal protrusion, which appears similar to the
prior exam. No spinal canal stenosis. Mild-to-moderate bilateral
neural foraminal narrowing, unchanged.

C7-T1: No significant disc bulge. No spinal canal stenosis or
neuroforaminal narrowing.
IMPRESSION: 1. Status post interval ACDF C5-C6, with no residual spinal canal
stenosis or neural foraminal narrowing.
2. Redemonstrated C6-C7 right subarticular/foraminal disc
protrusion, which causes mild-to-moderate bilateral neural foraminal
narrowing and likely contacts the right C7 nerve.

## 2023-08-27 DIAGNOSIS — M5416 Radiculopathy, lumbar region: Secondary | ICD-10-CM | POA: Diagnosis not present

## 2023-08-27 DIAGNOSIS — M47812 Spondylosis without myelopathy or radiculopathy, cervical region: Secondary | ICD-10-CM | POA: Diagnosis not present

## 2023-09-22 DIAGNOSIS — M47812 Spondylosis without myelopathy or radiculopathy, cervical region: Secondary | ICD-10-CM | POA: Diagnosis not present

## 2023-10-13 DIAGNOSIS — M47812 Spondylosis without myelopathy or radiculopathy, cervical region: Secondary | ICD-10-CM | POA: Diagnosis not present

## 2023-10-15 DIAGNOSIS — G43909 Migraine, unspecified, not intractable, without status migrainosus: Secondary | ICD-10-CM | POA: Diagnosis not present

## 2023-10-15 DIAGNOSIS — K5909 Other constipation: Secondary | ICD-10-CM | POA: Diagnosis not present

## 2023-11-05 DIAGNOSIS — M5416 Radiculopathy, lumbar region: Secondary | ICD-10-CM | POA: Diagnosis not present

## 2023-11-11 DIAGNOSIS — M47812 Spondylosis without myelopathy or radiculopathy, cervical region: Secondary | ICD-10-CM | POA: Diagnosis not present

## 2023-11-18 ENCOUNTER — Other Ambulatory Visit: Payer: Self-pay | Admitting: Adult Health

## 2023-11-21 ENCOUNTER — Other Ambulatory Visit: Payer: Self-pay | Admitting: Adult Health

## 2023-12-17 DIAGNOSIS — J029 Acute pharyngitis, unspecified: Secondary | ICD-10-CM | POA: Diagnosis not present

## 2023-12-17 DIAGNOSIS — Z112 Encounter for screening for other bacterial diseases: Secondary | ICD-10-CM | POA: Diagnosis not present

## 2023-12-23 DIAGNOSIS — Z1231 Encounter for screening mammogram for malignant neoplasm of breast: Secondary | ICD-10-CM | POA: Diagnosis not present

## 2023-12-24 DIAGNOSIS — M5416 Radiculopathy, lumbar region: Secondary | ICD-10-CM | POA: Diagnosis not present

## 2023-12-29 ENCOUNTER — Ambulatory Visit: Admitting: Neurology

## 2023-12-30 DIAGNOSIS — J029 Acute pharyngitis, unspecified: Secondary | ICD-10-CM | POA: Diagnosis not present

## 2023-12-30 DIAGNOSIS — R059 Cough, unspecified: Secondary | ICD-10-CM | POA: Diagnosis not present

## 2024-01-20 DIAGNOSIS — R232 Flushing: Secondary | ICD-10-CM | POA: Diagnosis not present

## 2024-01-20 DIAGNOSIS — K5909 Other constipation: Secondary | ICD-10-CM | POA: Diagnosis not present

## 2024-01-20 DIAGNOSIS — R5383 Other fatigue: Secondary | ICD-10-CM | POA: Diagnosis not present

## 2024-01-20 DIAGNOSIS — I10 Essential (primary) hypertension: Secondary | ICD-10-CM | POA: Diagnosis not present

## 2024-01-20 DIAGNOSIS — R61 Generalized hyperhidrosis: Secondary | ICD-10-CM | POA: Diagnosis not present

## 2024-02-02 ENCOUNTER — Other Ambulatory Visit: Payer: Self-pay | Admitting: Adult Health

## 2024-02-03 ENCOUNTER — Other Ambulatory Visit: Payer: Self-pay | Admitting: Adult Health

## 2024-02-09 DIAGNOSIS — M5416 Radiculopathy, lumbar region: Secondary | ICD-10-CM | POA: Diagnosis not present

## 2024-02-17 ENCOUNTER — Ambulatory Visit: Admitting: Neurology

## 2024-02-20 ENCOUNTER — Telehealth: Payer: Self-pay | Admitting: Neurology

## 2024-02-20 ENCOUNTER — Encounter: Payer: Self-pay | Admitting: Neurology

## 2024-02-20 ENCOUNTER — Ambulatory Visit: Admitting: Neurology

## 2024-02-20 VITALS — BP 141/99 | HR 81 | Ht 67.0 in | Wt 164.0 lb

## 2024-02-20 DIAGNOSIS — G441 Vascular headache, not elsewhere classified: Secondary | ICD-10-CM

## 2024-02-20 DIAGNOSIS — G43111 Migraine with aura, intractable, with status migrainosus: Secondary | ICD-10-CM

## 2024-02-20 MED ORDER — RIZATRIPTAN BENZOATE 10 MG PO TBDP: 10.0000 mg | ORAL_TABLET | ORAL | 5 refills | Status: AC | PRN

## 2024-02-20 MED ORDER — NURTEC 75 MG PO TBDP: 75.0000 mg | ORAL_TABLET | ORAL | 5 refills | Status: AC

## 2024-02-20 NOTE — Addendum Note (Signed)
 Addended by: CHALICE SAUNAS on: 02/20/2024 12:32 PM   Modules accepted: Orders

## 2024-02-20 NOTE — Telephone Encounter (Signed)
 no auth required sent to [facility] [phone]

## 2024-02-20 NOTE — Patient Instructions (Addendum)
 Rimegepant Disintegrating Tablets What is this medication? RIMEGEPANT (ri ME je pant) prevents and treats migraines. It works by blocking a substance in the body that causes migraines. This medicine may be used for other purposes; ask your health care provider or pharmacist if you have questions. COMMON BRAND NAME(S): NURTEC ODT What should I tell my care team before I take this medication? They need to know if you have any of these conditions: Circulation problems in fingers or toes (Raynaud syndrome) High blood pressure Kidney disease Liver disease An unusual or allergic reaction to rimegepant, other medications, foods, dyes, or preservatives Pregnant or trying to get pregnant Breastfeeding How should I use this medication? Take this medication by mouth. Take it as directed on the prescription label. Leave the tablet in the sealed pack until you are ready to take it. With dry hands, open the pack and gently remove the tablet. If the tablet breaks or crumbles, throw it away. Use a new tablet. Place the tablet in the mouth and allow it to dissolve. Then, swallow it. Do not cut, crush, or chew this medication. You do not need water to take this medication. Talk to your care team about the use of this medication in children. Special care may be needed. Overdosage: If you think you have taken too much of this medicine contact a poison control center or emergency room at once. NOTE: This medicine is only for you. Do not share this medicine with others. What if I miss a dose? This does not apply. This medication is not for regular use. What may interact with this medication? Certain medications for fungal infections, such as fluconazole , itraconazole Rifampin This list may not describe all possible interactions. Give your health care provider a list of all the medicines, herbs, non-prescription drugs, or dietary supplements you use. Also tell them if you smoke, drink alcohol, or use illegal  drugs. Some items may interact with your medicine. What should I watch for while using this medication? Visit your care team for regular checks on your progress. Tell your care team if your symptoms do not start to get better or if they get worse. What side effects may I notice from receiving this medication? Side effects that you should report to your care team as soon as possible: Allergic reactions or angioedema--skin rash, itching or hives, swelling of the face, eyes, lips, tongue, arms, or legs, trouble swallowing or breathing Increase in blood pressure Raynaud syndrome--cool, numb, or painful fingers or toes that may change color from pale, to blue, to red Side effects that usually do not require medical attention (report these to your care team if they continue or are bothersome): Nausea Stomach pain This list may not describe all possible side effects. Call your doctor for medical advice about side effects. You may report side effects to FDA at 1-800-FDA-1088. Where should I keep my medication? Keep out of the reach of children and pets. Store at room temperature between 20 and 25 degrees C (68 and 77 degrees F). Get rid of any unused medication after the expiration date. To get rid of medications that are no longer needed or have expired: Take the medication to a medication take-back program. Check with your pharmacy or law enforcement to find a location. If you cannot return the medication, check the label or package insert to see if the medication should be thrown out in the garbage or flushed down the toilet. If you are not sure, ask your care team. If it  is safe to put it in the trash, take the medication out of the container. Mix the medication with cat litter, dirt, coffee grounds, or other unwanted substance. Seal the mixture in a bag or container. Put it in the trash. NOTE: This sheet is a summary. It may not cover all possible information. If you have questions about this  medicine, talk to your doctor, pharmacist, or health care provider.  2025 Elsevier/Gold Standard (2023-07-10 00:00:00)  Mindfulness-Based Stress Reduction: What to Know Mindfulness-based stress reduction (MBSR) is a mindfulness meditation program that normally takes place over 8 weeks. It usually includes weekly group classes and daily exercises to do at home. What are the benefits of MBSR? Mindfulness meditation therapies, like MBSR, can change a person's brain and body in good ways, and make them healthier. MBSR can have many benefits, such as: Helping to lower stress hormones. Decreasing symptoms or helping to deal with symptoms of different conditions, like: Anxiety, which is feeling worried or nervous. Long-lasting pain. This is pain that lasts more than 3 months. Stress and worry. Trouble sleeping. Headaches, like migraines and tension headaches. Irritable bowel syndrome. Helping to handle stress from things you can't control, like: Long-term illnesses, especially if you have a lot of pain or other difficult symptoms. Big life events. Stress at work. Stress from taking care of someone else. Types of MBSR exercises Mindfulness. This is a common type of meditation. Meditation. It helps you focus your mind to feel calm and happy. It has two main parts: paying attention and accepting. Paying attention means focusing on what is happening right now. This usually means noticing your breathing, your thoughts, how your body feels, and your emotions. Accepting means noticing these feelings and sensations without judging them. Instead of reacting to these thoughts or feelings, you just observe them and let them pass. MSBR exercises include: Body scanning. This is a mindfulness exercise where you pay attention to how different parts of your body feel. You can do this while lying down or sitting up. Sitting meditations. In this exercise, you focus on something like your breathing. When your  mind starts to wander, gently bring it back to your breath. Keep doing this every time you notice your mind wandering. Mindful movements. This exercise involves moving and stretching your body slowly while paying attention to how it feels. Mindful Tasks. This means paying attention to how your body feels while doing things like walking or eating. Follow these instructions at home:  Find an in-person MBSR program or find a program that is online. Find a podcast or recording that provides guidance for MSBR exercises. Look for a therapist who knows how to use MBSR. Follow your treatment plan as told by your health care provider. This may include taking regular medicines and making changes to your diet or lifestyle. Where to find more information You can find more information about MBSR from: Your provider. Community-based meditation centers or programs. American Psychological Association at http://forbes-duran.com/. This information is not intended to replace advice given to you by your health care provider. Make sure you discuss any questions you have with your health care provider. Document Revised: 06/05/2023 Document Reviewed: 06/05/2023 Elsevier Patient Education  2025 Elsevier Inc.  Migraine Headache A migraine headache is an intense pulsing or throbbing pain on one or both sides of the head. Migraine headaches may also cause other symptoms, such as nausea, vomiting, and sensitivity to light and noise. A migraine headache can last from 4 hours to 3 days. Talk  with your health care provider about what things may bring on (trigger) your migraine headaches. What are the causes? The exact cause is not known. However, a migraine may be caused when nerves in the brain get irritated and release chemicals that cause blood vessels to become inflamed. This inflammation causes pain. Migraines may be triggered or caused by: Smoking. Medicines, such as: Nitroglycerin, which is  used to treat chest pain. Birth control pills. Estrogen. Certain blood pressure medicines. Foods or drinks that contain nitrates, glutamate, aspartame, MSG, or tyramine. Certain foods or drinks, such as aged cheeses, chocolate, alcohol, or caffeine . Doing physical activity that is very hard. Other triggers may include: Menstruation. Pregnancy. Hunger. Stress. Getting too much or too little sleep. Weather changes. Tiredness (fatigue). What increases the risk? The following factors may make you more likely to have migraine headaches: Being between the ages of 73-32 years old. Being female. Having a family history of migraine headaches. Being Caucasian. Having a mental health condition, such as depression or anxiety. Being obese. What are the signs or symptoms? The main symptom of this condition is pulsing or throbbing pain. This pain may: Happen in any area of the head, such as on one or both sides. Make it hard to do daily activities. Get worse with physical activity. Get worse around bright lights, loud noises, or smells. Other symptoms may include: Nausea. Vomiting. Dizziness. Before a migraine headache starts, you may get warning signs (an aura). An aura may include: Seeing flashing lights or having blind spots. Seeing bright spots, halos, or zigzag lines. Having tunnel vision or blurred vision. Having numbness or a tingling feeling. Having trouble talking. Having muscle weakness. After a migraine ends, you may have symptoms. These may include: Feeling tired. Trouble concentrating. How is this diagnosed? A migraine headache can be diagnosed based on: Your symptoms. A physical exam. Tests, such as: A CT scan or an MRI of the head. These tests can help rule out other causes of headaches. Taking fluid from the spine (lumbar puncture) to examine it (cerebrospinal fluid analysis, or CSF analysis). How is this treated? This condition may be treated with medicines  that: Relieve pain and nausea. Prevent migraines. Treatment may also include: Acupuncture. Lifestyle changes like avoiding foods that trigger migraine headaches. Learning ways to control your body (biofeedback). Talk therapy to help you know and deal with negative thoughts (cognitive behavioral therapy). Follow these instructions at home: Medicines Take over-the-counter and prescription medicines only as told by your provider. Ask your provider if the medicine prescribed to you: Requires you to avoid driving or using machinery. Can cause constipation. You may need to take these actions to prevent or treat constipation: Drink enough fluid to keep your pee (urine) pale yellow. Take over-the-counter or prescription medicines. Eat foods that are high in fiber, such as beans, whole grains, and fresh fruits and vegetables. Limit foods that are high in fat and processed sugars, such as fried or sweet foods. Lifestyle  Do not drink alcohol. Do not use any products that contain nicotine or tobacco. These products include cigarettes, chewing tobacco, and vaping devices, such as e-cigarettes. If you need help quitting, ask your provider. Get 7-9 hours of sleep each night, or the amount recommended by your provider. Find ways to manage stress, such as meditation, deep breathing, or yoga. Try to exercise regularly. This can help lessen how bad and how often your migraines occur. General instructions Keep a journal to find out what triggers your migraines, so you can  avoid those things. For example, write down: What you eat and drink. How much sleep you get. Any change to your diet or medicines. If you have a migraine headache: Avoid things that make your symptoms worse, such as bright lights. Lie down in a dark, quiet room. Do not drive or use machinery. Ask your provider what activities are safe for you while you have symptoms. Keep all follow-up visits. Your provider will monitor your  symptoms and recommend any further treatment. Where to find more information Coalition for Headache and Migraine Patients (CHAMP): headachemigraine.org American Migraine Foundation: americanmigrainefoundation.org National Headache Foundation: headaches.org Contact a health care provider if: You have symptoms that are different or worse than your usual migraine headache symptoms. You have more than 15 days of headaches in one month. Get help right away if: Your migraine headache becomes severe or lasts more than 72 hours. You have a fever or stiff neck. You have vision loss. Your muscles feel weak or like you cannot control them. You lose your balance often or have trouble walking. You faint. You have a seizure. This information is not intended to replace advice given to you by your health care provider. Make sure you discuss any questions you have with your health care provider. Document Revised: 11/26/2021 Document Reviewed: 11/26/2021 Elsevier Patient Education  2024 Elsevier Inc.  Migraine Headache A migraine headache is a very strong throbbing pain on one or both sides of your head. This type of headache can also cause other symptoms. It can last from 4 hours to 3 days. Talk with your doctor about what things may bring on (trigger) this condition. What are the causes? The exact cause of a migraine is not known. This condition may be brought on or caused by: Smoking. Medicines, such as: Medicine used to treat chest pain (nitroglycerin). Birth control pills. Estrogen. Some blood pressure medicines. Certain substances in some foods or drinks. Foods and drinks, such as: Cheese. Chocolate. Alcohol. Caffeine . Doing physical activity that is very hard. Other things that may trigger a migraine headache include: Periods. Pregnancy. Hunger. Stress. Getting too much or too little sleep. Weather changes. Feeling tired (fatigue). What increases the risk? Being 22-55 years  old. Being female. Having a family history of migraine headaches. Being Caucasian. Having a mental health condition, such as being sad (depressed) or feeling worried or nervous (anxious). Being very overweight (obese). What are the signs or symptoms? A throbbing pain. This pain may: Happen in any area of the head, such as on one or both sides. Make it hard to do daily activities. Get worse with physical activity. Get worse around bright lights, loud noises, or smells. Other symptoms may include: Feeling like you may vomit (nauseous). Vomiting. Dizziness. Before a migraine headache starts, you may get warning signs (an aura). An aura may include: Seeing flashing lights or having blind spots. Seeing bright spots, halos, or zigzag lines. Having tunnel vision or blurred vision. Having numbness or a tingling feeling. Having trouble talking. Having weak muscles. After a migraine ends, you may have symptoms. These may include: Tiredness. Trouble thinking (concentrating). How is this treated? Taking medicines that: Relieve pain. Relieve the feeling like you may vomit. Prevent migraine headaches. Treatment may also include: Acupuncture. Lifestyle changes like avoiding foods that bring on migraine headaches. Learning ways to control your body functions (biofeedback). Therapy to help you know and deal with negative thoughts (cognitive behavioral therapy). Follow these instructions at home: Medicines Take over-the-counter and prescription medicines only as told  by your doctor. If told, take steps to prevent problems with pooping (constipation). You may need to: Drink enough fluid to keep your pee (urine) pale yellow. Take medicines. You will be told what medicines to take. Eat foods that are high in fiber. These include beans, whole grains, and fresh fruits and vegetables. Limit foods that are high in fat and sugar. These include fried or sweet foods. Ask your doctor if you should  avoid driving or using machines while you are taking your medicine. Lifestyle  Do not drink alcohol. Do not smoke or use any products that contain nicotine or tobacco. If you need help quitting, ask your doctor. Get 7-9 hours of sleep each night, or the amount recommended by your doctor. Find ways to deal with stress, such as meditation, deep breathing, or yoga. Try to exercise often. This can help lessen how bad and how often your migraines happen. General instructions Keep a journal to find out what may bring on your migraine headaches. This can help you avoid those things. For example, write down: What you eat and drink. How much sleep you get. Any change to your medicines or diet. If you have a migraine headache: Avoid things that make your symptoms worse, such as bright lights. Lie down in a dark, quiet room. Do not drive or use machinery. Ask your doctor what activities are safe for you. Where to find more information Coalition for Headache and Migraine Patients (CHAMP): headachemigraine.org American Migraine Foundation: americanmigrainefoundation.org National Headache Foundation: headaches.org Contact a doctor if: You get a migraine headache that is different or worse than others you have had. You have more than 15 days of headaches in one month. Get help right away if: Your migraine headache gets very bad. Your migraine headache lasts more than 72 hours. You have a fever or stiff neck. You have trouble seeing. Your muscles feel weak or like you cannot control them. You lose your balance a lot. You have trouble walking. You faint. You have a seizure. This information is not intended to replace advice given to you by your health care provider. Make sure you discuss any questions you have with your health care provider. Document Revised: 11/26/2021 Document Reviewed: 11/26/2021 Elsevier Patient Education  2024 Elsevier Inc.  Assessment: Total time for face to face  interview and examination, for review of  images and laboratory testing, neurophysiology testing and pre-existing records, including out-of -network , was 45 minutes. Assessment is as follows here:  0) Multiple pain and discomfort diagnosis, not to be followed here. On  disability since 2023 age 58 for chronic pain.    1)   Migraines 3-5 a months,  no other headaches. Not chronic migraine.  Migraine with aura and status migrainosus.  2)  rare sinus headaches, more seasonal.   Plan:  Treatment plan and additional workup planned after today includes:      1) New MRI needed.  I will also add an MRA as her headaches have changed in character , frequency and intensity- rue out retro-orbital aneurysm in a vascular headache type that starts behind the right eye.    2) Imitrex  no longer works, propanolol and topiramate are no longer as effective ( but not ineffective )  -   before starting the preventive regimen she had 10 or more migraines a month, now 4-5 a month and in between less than 2 a month.  I would continue these preventives and add NURTEC 75 mg as a preventive every other day.  3) Unfortunately , our lab was closed by the time this visit concluded on 02-20-2024. I will order for future draw a CMET with a  vascular panel.   There are many lifestyle related migraine triggers that the patient  may have been unable to identify:   Mindfulness- stress behaviour modification.  Helps with sleep, with appetite and anxiety .   May have to fail another  triptan before allowing Nurtec as acute abortive medication -  but we will use it now as new preventive medication to be taken every other day intake for migraine care.    Migraine friendly diet and sleep habits, reduce caffeine  intake, one beverage a day. Not a smoker, no alcohol.   She reports no morning headaches but a lower threshold for migraine if she had a reduced sleep duration the night before.  A sleep study can be considered if her  migraines remain poorly controlled after medication changes   I will not use any narcotic or potentially abuse- risk  related medications.   She is on pain medication which are  managed by PCP>      Rv in 6 months with headache journal in 6 months.

## 2024-02-20 NOTE — Progress Notes (Addendum)
 [facility]  Provider:  Dr Shun Pletz Referring Provider: Dow Longs, PA-C Primary Care Physician:  Atilano Deward ORN, MD  Chief Complaint  Patient presents with   RM 1     Patient is her alone for migraines - nurtec works for her migraine issues. MRI in the past was normal. Headaches move from the back oh the head to the front only on the right side. Rarely on the left side     HPI:  Michelle Gonzalez is a 48 y.o. female and seen here on 02/20/2024 upon referral from Dr. Dow for a Consultation/ Evaluation of migraine : Migraine : onset at age 74 after birth of her son- not as frequent at the time.  Not menstrual.  This patient reports onset of Migraines over a period of years- 7 or 8 years.  And in the last 3 years more frequent.  She thinks she has been under more  stress,  no other triggers : weather, sleep or food  are not known.   Her PCP has started her on TPM and propanolol. She noted the migraines have recently outpaced the medications. Took some NSAIDS.   Sumatriptan   helped for a long time and stopped working.  She had been given  Nurtec and had success, but insurance refused to cover.  Right-sided  right eye retro-orbital sharp pain-  radiating over the skull to the right parietal area-  migraine headaches over the last years, photosensitivity, nauseated . Blurred vision., visual aura preceding the migraines.  MRI brain was normal, but is now 80.49 years old.    She  lives alone, her son will visits from to time to time, both help with her fathers care.  She has a zoo in the yard:  4 cats,   groundhog, racoon, constellation energy and 3 skunks.    Review of Systems: Out of a complete 14 system review, the patient complains of only the following symptoms, and all other reviewed systems are negative.  Anxiety, panic attacks, depression.     Social History   Socioeconomic History   Marital status: Single    Spouse name: Not on file   Number of children: Not on file    Years of education: Not on file   Highest education level: Not on file  Occupational History   Not on file  Tobacco Use   Smoking status: Never   Smokeless tobacco: Never  Vaping Use   Vaping status: Never Used  Substance and Sexual Activity   Alcohol use: Yes    Comment: socially   Drug use: No   Sexual activity: Yes    Birth control/protection: Pill  Other Topics Concern   Not on file  Social History Narrative   ** Merged History Encounter **       1/2 cup of coffee weekly, some tea weekly    Social Drivers of Health   Financial Resource Strain: Low Risk  (07/29/2019)   Overall Financial Resource Strain (CARDIA)    Difficulty of Paying Living Expenses: Not hard at all  Food Insecurity: No Food Insecurity (07/29/2019)   Hunger Vital Sign    Worried About Running Out of Food in the Last Year: Never true    Ran Out of Food in the Last Year: Never true  Transportation Needs: No Transportation Needs (07/29/2019)   PRAPARE - Administrator, Civil Service (Medical): No    Lack of Transportation (Non-Medical): No  Physical Activity: Insufficiently Active (07/29/2019)   Exercise  Vital Sign    Days of Exercise per Week: 2 days    Minutes of Exercise per Session: 20 min  Stress: No Stress Concern Present (07/29/2019)   [employer] of Occupational Health - Occupational Stress Questionnaire    Feeling of Stress : Not at all  Social Connections: Moderately Integrated (07/29/2019)   Social Connection and Isolation Panel    Frequency of Communication with Friends and Family: More than three times a week    Frequency of Social Gatherings with Friends and Family: Once a week    Attends Religious Services: More than 4 times per year    Active Member of [employer] or Organizations: No    Attends Banker Meetings: Never    Marital Status: Living with partner  Intimate Partner Violence: Unknown (07/29/2019)   Humiliation, Afraid, Rape, and Kick questionnaire    Fear  of Current or Ex-Partner: No    Emotionally Abused: No    Physically Abused: Not on file    Sexually Abused: No    Family History  Problem Relation Age of Onset   Diabetes Father    Cancer Maternal Grandfather    Cancer Paternal Grandmother    Cancer Paternal Grandfather    Migraines Neg Hx    Seizures Neg Hx    Stroke Neg Hx     Past Medical History:  Diagnosis Date   Abnormal Papanicolaou smear of cervix with positive human papilloma virus (HPV) test 07/04/2017   LSIL with +HPV, colpo appt made    Anxiety    Dyspareunia 01/05/2014   GERD (gastroesophageal reflux disease)    Herniated disc    Menstrual extraction 06/28/2013   Migraines    Other and unspecified ovarian cyst 03/30/2013   Unspecified symptom associated with female genital organs 03/25/2013   UTI (lower urinary tract infection)     Past Surgical History:  Procedure Laterality Date   CARPAL TUNNEL RELEASE Right 08/29/2021   CESAREAN SECTION     CYSTOSCOPY WITH STENT PLACEMENT Left 04/11/2018   Procedure: CYSTOSCOPY WITH RETROGRADE AND LEFT STENT PLACEMENT;  Surgeon: Matilda Senior, MD;  Location: [facility];  Service: Urology;  Laterality: Left;   CYSTOSCOPY/URETEROSCOPY/HOLMIUM LASER/STENT PLACEMENT Left 04/23/2018   Procedure: CYSTOSCOPY/URETEROSCOPY/HOLMIUM LASER/STENT EXTRATION AND PLACEMENT OF LEFT STENT;  Surgeon: Matilda Senior, MD;  Location: [facility];  Service: Urology;  Laterality: Left;  75 MINS   LAPAROSCOPY     WISDOM TOOTH EXTRACTION      Current Outpatient Medications  Medication Sig Dispense Refill   ADDERALL 30 MG tablet Take 30 mg by mouth 2 (two) times daily.     ALPRAZolam (XANAX) 0.5 MG tablet Take 0.5 mg by mouth 3 (three) times daily as needed.     BLISOVI FE 1/20 1-20 MG-MCG tablet TAKE 1 TABLET BY MOUTH EVERY DAY FOR 21 DAYS, SKIP PLACEBO WEEK AND START NEW PACK 84 tablet 0   DULoxetine (CYMBALTA) 30 MG capsule Take by mouth.     DULoxetine (CYMBALTA) 60 MG capsule Take 120 mg by  mouth daily.     glycopyrrolate (ROBINUL) 1 MG tablet Take 1 mg by mouth 2 (two) times daily.     oxyCODONE -acetaminophen  (PERCOCET/ROXICET) 5-325 MG tablet Take 1 tablet by mouth every 6 (six) hours as needed for moderate pain or severe pain.     propranolol ER (INDERAL LA) 60 MG 24 hr capsule Take 60 mg by mouth daily.     topiramate (TOPAMAX) 25 MG tablet Take 25 mg by mouth 2 (  two) times daily.     fluconazole  (DIFLUCAN ) 150 MG tablet TAKE ONE TABLET BY MOUTH ONCE, THEN REPEAT IN 3 DAYS IF NEEDED. (Patient not taking: Reported on 02/20/2024) 2 tablet 6   rizatriptan (MAXALT) 10 MG tablet Take by mouth.     valACYclovir  (VALTREX ) 1000 MG tablet Take 1 tablet (1,000 mg total) by mouth 2 (two) times daily. Take 1 bid x 10 days 20 tablet 1   zolpidem (AMBIEN) 10 MG tablet Take 10 mg by mouth at bedtime.     No current facility-administered medications for this visit.    Allergies as of 02/20/2024 - Review Complete 01/03/2022  Allergen Reaction Noted   Sulfa antibiotics Swelling 03/25/2013   Sulfa antibiotics  04/10/2018    Vitals: BP (!) 141/99 (Cuff Size: Normal)   Pulse 81   Ht 5' 7 (1.702 m)   Wt 164 lb (74.4 kg)   BMI 25.69 kg/m   Physical exam:  General: The patient is awake, alert and appears not in acute distress.  The patient is well groomed. Head: Normocephalic, atraumatic.  Neck is supple.  Neck circumference:13. 5 Cardiovascular:  Regular rate and palpable peripheral pulse:  Respiratory: clear to auscultation.  Mallampati2, Skin:  Without evidence of edema, or rash Trunk:  slender    Neurologic exam : The patient is awake and alert, oriented to place and time.   Memory subjective  described as intact.  There is a normal attention span & concentration ability.  Speech is fluent without  dysarthria, dysphonia or aphasia.  Mood and affect are appropriate.  Cranial nerves: Pupils are equal and briskly reactive to light.  Funduscopic exam without any evidence of  pallor or edema.  Extraocular movements  in vertical and horizontal planes intact and without nystagmus. Visual fields by finger perimetry are intact. Hearing to finger rub intact.  Facial sensation intact to fine touch.  Facial motor strength is symmetric and tongue and uvula move midline.  Motor exam:   Normal tone and normal muscle bulk and symmetric normal strength in all extremities. Grip Strength equal .  Proximal strength of shoulder muscles and hip flexors was intact   Sensory:  Fine touch and vibration were tested and intact   Coordination: normal.  Gait and station: Patient walked  without assistive device .  Core Strength within normal limits.  Stance is stable and of wide Base.  Tandem gait is intact,  Turns with 3 Steps are unfragmented.  Romberg testing  ; deferred.   Deep tendon reflexes: in the  upper and lower extremities are symmetric    Assessment: Total time for face to face interview and examination, for review of  images and laboratory testing, neurophysiology testing and pre-existing records, including out-of -network , was 45 minutes. Assessment is as follows here:  0) Multiple pain and discomfort diagnosis, not to be followed here. On  disability since 2023 age 44 for chronic pain.    1)   Migraines 3-5 a months,  no other headaches. Not chronic migraine.  Migraine with aura and status migrainosus.  2)  rare sinus headaches, more seasonal.   Plan:  Treatment plan and additional workup planned after today includes:      1) New MRI needed.  I will also add an MRA as her headaches have changed in character , frequency and intensity- rue out retro-orbital aneurysm .   2) Imitrex  no longer works, propanolol and topiramate are no longer as  effective -  but before starting  the preventive regimen she had 10 or more migraines a month, now 4-5 a month and in between less than 2 a month. I would continue these preventives and add NURTEC 75 mg as a preventive every  other day.   3) Unfortunately , our lab was closed by the time this visit concluded on 02-20-2024. I will order for future draw a CMET,  and vasculitis panel.   There are many lifestyle related migraine triggers that the patient  may have been unable to identify:   Mindfulness- stress behaviour modification.  Helps with sleep, with appetite and anxiety .   May have to fail another  triptan before allowing Nurtec as acute abortive medication -  but we will use it now as new preventive medication to be taken every other day intake for migraine care.    Migraine friendly diet and sleep habits, reduce caffeine  intake, one beverage a day. Not a smoker, no alcohol.   She reports no morning headaches but a lower threshold for migraine if she had a reduced sleep duration the night before.  A sleep study can be considered if her migraines remain poorly controlled after medication changes   I will not use any narcotic or potentially abuse- risk  related medications.   She is on pain medication which are  managed by PCP>      Rv in 6 months with NP -  bring a  headache journal in 6 months.    The patient's condition requires frequent monitoring and adjustments in the treatment plan, reflecting the ongoing complexity of care.  This provider is the continuing focal point for all needed services for this condition.   Dedra Gores, MD  [facility] Board certified by The [employer] of Sleep Medicine and Diplomate of the [employer] of [employer]. Board certified In Neurology through the ABPN, Fellow of the [employer] of Neu[employer].

## 2024-03-30 ENCOUNTER — Inpatient Hospital Stay: Admission: RE | Admit: 2024-03-30 | Discharge: 2024-03-30 | Attending: Neurology | Admitting: Neurology

## 2024-03-30 DIAGNOSIS — G43111 Migraine with aura, intractable, with status migrainosus: Secondary | ICD-10-CM | POA: Diagnosis not present

## 2024-03-30 MED ORDER — GADOPICLENOL 0.5 MMOL/ML IV SOLN
7.0000 mL | Freq: Once | INTRAVENOUS | Status: AC | PRN
  Administered 2024-03-30: 14:00:00 7 mL via INTRAVENOUS

## 2024-03-31 ENCOUNTER — Ambulatory Visit: Payer: Self-pay | Admitting: Neurology

## 2024-04-06 ENCOUNTER — Other Ambulatory Visit: Payer: Self-pay | Admitting: Adult Health

## 2024-04-08 ENCOUNTER — Other Ambulatory Visit: Payer: Self-pay | Admitting: Adult Health

## 2024-04-27 ENCOUNTER — Other Ambulatory Visit: Payer: Self-pay | Admitting: Adult Health

## 2024-04-28 ENCOUNTER — Other Ambulatory Visit: Payer: Self-pay | Admitting: Adult Health

## 2024-04-28 ENCOUNTER — Telehealth: Payer: Self-pay | Admitting: Adult Health

## 2024-04-28 MED ORDER — AUROVELA FE 1/20 1-20 MG-MCG PO TABS: 1.0000 | ORAL_TABLET | Freq: Every day | ORAL | 0 refills | Status: DC

## 2024-04-28 NOTE — Progress Notes (Signed)
 Refilled aurovela  1/20 x 1

## 2024-04-28 NOTE — Telephone Encounter (Signed)
 Call can't be completed at 1:10 pm. Will also send a mychart message. JSY

## 2024-04-28 NOTE — Telephone Encounter (Signed)
 Pt request call regarding preauthorization denial for medication refill.

## 2024-05-01 ENCOUNTER — Other Ambulatory Visit: Payer: Self-pay | Admitting: Adult Health

## 2024-05-12 ENCOUNTER — Other Ambulatory Visit: Payer: Self-pay | Admitting: Adult Health

## 2024-05-12 MED ORDER — AUROVELA FE 1/20 1-20 MG-MCG PO TABS: 1.0000 | ORAL_TABLET | Freq: Every day | ORAL | 0 refills | Status: DC

## 2024-05-12 NOTE — Progress Notes (Signed)
 Refilled aurovela , has appt in April

## 2024-05-21 ENCOUNTER — Other Ambulatory Visit: Payer: Self-pay | Admitting: Adult Health

## 2024-07-14 ENCOUNTER — Ambulatory Visit: Admitting: Adult Health

## 2024-09-16 ENCOUNTER — Ambulatory Visit: Admitting: Adult Health
